# Patient Record
Sex: Female | Born: 1950 | Race: White | Hispanic: No | Marital: Married | Smoking: Former smoker
Health system: Southern US, Community
[De-identification: ages and names within clinical notes are randomized; demographics above are authoritative.]

## PROBLEM LIST (undated history)

## (undated) DIAGNOSIS — F32A Depression, unspecified: Secondary | ICD-10-CM

## (undated) DIAGNOSIS — E559 Vitamin D deficiency, unspecified: Secondary | ICD-10-CM

## (undated) DIAGNOSIS — K222 Esophageal obstruction: Secondary | ICD-10-CM

## (undated) DIAGNOSIS — Z8719 Personal history of other diseases of the digestive system: Secondary | ICD-10-CM

## (undated) DIAGNOSIS — N390 Urinary tract infection, site not specified: Secondary | ICD-10-CM

## (undated) DIAGNOSIS — F329 Major depressive disorder, single episode, unspecified: Secondary | ICD-10-CM

## (undated) DIAGNOSIS — D649 Anemia, unspecified: Secondary | ICD-10-CM

## (undated) HISTORY — DX: Depression, unspecified: F32.A

## (undated) HISTORY — PX: ENDOMETRIAL ABLATION: SHX621

## (undated) HISTORY — DX: Major depressive disorder, single episode, unspecified: F32.9

## (undated) HISTORY — PX: TUBAL LIGATION: SHX77

## (undated) HISTORY — DX: Personal history of other diseases of the digestive system: Z87.19

## (undated) HISTORY — DX: Vitamin D deficiency, unspecified: E55.9

## (undated) HISTORY — DX: Urinary tract infection, site not specified: N39.0

## (undated) HISTORY — DX: Esophageal obstruction: K22.2

## (undated) HISTORY — PX: UPPER GASTROINTESTINAL ENDOSCOPY: SHX188

---

## 2004-07-02 HISTORY — PX: COLONOSCOPY: SHX174

## 2004-09-06 ENCOUNTER — Emergency Department: Payer: Self-pay | Admitting: Emergency Medicine

## 2004-11-23 ENCOUNTER — Ambulatory Visit: Payer: Self-pay | Admitting: Unknown Physician Specialty

## 2005-02-09 ENCOUNTER — Ambulatory Visit: Payer: Self-pay | Admitting: Obstetrics and Gynecology

## 2009-05-05 ENCOUNTER — Ambulatory Visit: Payer: Self-pay | Admitting: Unknown Physician Specialty

## 2009-07-21 ENCOUNTER — Ambulatory Visit: Payer: Self-pay | Admitting: Internal Medicine

## 2010-11-30 ENCOUNTER — Ambulatory Visit: Payer: Self-pay | Admitting: Family Medicine

## 2010-12-13 ENCOUNTER — Ambulatory Visit: Payer: Self-pay | Admitting: Family Medicine

## 2013-01-01 ENCOUNTER — Ambulatory Visit: Payer: Self-pay | Admitting: Unknown Physician Specialty

## 2013-06-11 ENCOUNTER — Ambulatory Visit: Payer: Self-pay | Admitting: Urology

## 2014-06-03 ENCOUNTER — Ambulatory Visit: Payer: Self-pay | Admitting: Cardiovascular Disease

## 2014-08-10 ENCOUNTER — Other Ambulatory Visit: Payer: Self-pay

## 2014-08-10 DIAGNOSIS — Z1231 Encounter for screening mammogram for malignant neoplasm of breast: Secondary | ICD-10-CM

## 2014-08-18 ENCOUNTER — Ambulatory Visit
Admission: RE | Admit: 2014-08-18 | Discharge: 2014-08-18 | Disposition: A | Discharge status: Home / Self Care | Payer: BLUE CROSS/BLUE SHIELD | Source: Ambulatory Visit

## 2014-08-18 DIAGNOSIS — Z1231 Encounter for screening mammogram for malignant neoplasm of breast: Secondary | ICD-10-CM

## 2014-10-01 ENCOUNTER — Ambulatory Visit
Admit: 2014-10-01 | Disposition: A | Discharge status: Home / Self Care | Payer: Self-pay | Attending: Family Medicine | Admitting: Family Medicine

## 2015-02-08 ENCOUNTER — Ambulatory Visit
Admission: EM | Admit: 2015-02-08 | Discharge: 2015-02-08 | Disposition: A | Discharge status: Home / Self Care | Payer: BLUE CROSS/BLUE SHIELD | Attending: Family Medicine | Admitting: Family Medicine

## 2015-02-08 ENCOUNTER — Other Ambulatory Visit: Payer: Self-pay

## 2015-02-08 ENCOUNTER — Encounter: Payer: Self-pay | Admitting: Emergency Medicine

## 2015-02-08 DIAGNOSIS — Z79899 Other long term (current) drug therapy: Secondary | ICD-10-CM | POA: Diagnosis not present

## 2015-02-08 DIAGNOSIS — Z87891 Personal history of nicotine dependence: Secondary | ICD-10-CM | POA: Diagnosis not present

## 2015-02-08 DIAGNOSIS — R002 Palpitations: Secondary | ICD-10-CM | POA: Insufficient documentation

## 2015-02-08 DIAGNOSIS — R000 Tachycardia, unspecified: Secondary | ICD-10-CM | POA: Diagnosis not present

## 2015-02-08 HISTORY — DX: Anemia, unspecified: D64.9

## 2015-02-08 LAB — COMPREHENSIVE METABOLIC PANEL
ALBUMIN: 4.3 g/dL (ref 3.5–5.0)
ALT: 22 U/L (ref 14–54)
AST: 19 U/L (ref 15–41)
Alkaline Phosphatase: 60 U/L (ref 38–126)
Anion gap: 8 (ref 5–15)
BILIRUBIN TOTAL: 0.7 mg/dL (ref 0.3–1.2)
BUN: 11 mg/dL (ref 6–20)
CALCIUM: 9.5 mg/dL (ref 8.9–10.3)
CO2: 26 mmol/L (ref 22–32)
CREATININE: 0.64 mg/dL (ref 0.44–1.00)
Chloride: 106 mmol/L (ref 101–111)
GFR calc Af Amer: >60 mL/min (ref >60)
GLUCOSE: 96 mg/dL (ref 65–99)
Potassium: 4.4 mmol/L (ref 3.5–5.1)
SODIUM: 140 mmol/L (ref 135–145)
Total Protein: 7.8 g/dL (ref 6.5–8.1)

## 2015-02-08 LAB — CBC WITH DIFFERENTIAL/PLATELET
Basophils Absolute: 0 10*3/uL (ref 0–0.1)
Basophils Relative: 1 %
EOS ABS: 0.1 10*3/uL (ref 0–0.7)
Eosinophils Relative: 2 %
HCT: 40.7 % (ref 35.0–47.0)
Hemoglobin: 13.7 g/dL (ref 12.0–16.0)
LYMPHS ABS: 2.1 10*3/uL (ref 1.0–3.6)
Lymphocytes Relative: 36 %
MCH: 28.6 pg (ref 26.0–34.0)
MCHC: 33.7 g/dL (ref 32.0–36.0)
MCV: 85 fL (ref 80.0–100.0)
MONOS PCT: 9 %
Monocytes Absolute: 0.5 10*3/uL (ref 0.2–0.9)
NEUTROS PCT: 52 %
Neutro Abs: 3 10*3/uL (ref 1.4–6.5)
Platelets: 237 10*3/uL (ref 150–440)
RBC: 4.79 MIL/uL (ref 3.80–5.20)
RDW: 13.5 % (ref 11.5–14.5)
WBC: 5.7 10*3/uL (ref 3.6–11.0)

## 2015-02-08 LAB — TSH: TSH: 1.619 u[IU]/mL (ref 0.350–4.500)

## 2015-02-08 NOTE — Discharge Instructions (Signed)
Holter Monitoring A Holter monitor is a small device with electrodes (small sticky patches) that attach to your chest. It records the electrical activity of your heart and is worn continuously for 24-48 hours.  A HOLTER MONITOR IS USED TO  Detect heart problems such as:  Heart arrhythmia. Is an abnormal or irregular heartbeat. With some heart arrhythmias, you may not feel or know that you have an irregular heart rhythm.  Palpitations, such as feeling your heart racing or fluttering. It is possible to have heart palpitations and not have a heart arrhythmia.  A heart rhythm that is too slow or too fast.  If you have problems fainting, near fainting or feeling light-headed, a Holter monitor may be worn to see if your heart is the cause. HOLTER MONITOR PREPARATION   Electrodes will be attached to the skin on your chest.  If you have hair on your chest, small areas may have to be shaved. This is done to help the patches stick better and make the recording more accurate.  The electrodes are attached by wires to the Holter monitor. The Holter monitor clips to your clothing. You will wear the monitor at all times, even while exercising and sleeping. HOME CARE INSTRUCTIONS   Wear your monitor at all times.  The wires and the monitor must stay dry. Do not get the monitor wet.  Do not bathe, swim or use a hot tub with it on.  You may do a "sponge" bath while you have the monitor on.  Keep your skin clean, do not put body lotion or moisturizer on your chest.  It's possible that your skin under the electrodes could become irritated. To keep this from happening, you may put the electrodes in slightly different places on your chest.  Your caregiver will also ask you to keep a diary of your activities, such as walking or doing chores. Be sure to note what you are doing if you experience heart symptoms such as palpitations. This will help your caregiver determine what might be contributing to your  symptoms. The information stored in your monitor will be reviewed by your caregiver alongside your diary entries.  Make sure the monitor is safely clipped to your clothing or in a location close to your body that your caregiver recommends.  The monitor and electrodes are removed when the test is over. Return the monitor as directed.  Be sure to follow up with your caregiver and discuss your Holter monitor results. SEEK IMMEDIATE MEDICAL CARE IF:  You faint or feel lightheaded.  You have trouble breathing.  You get pain in your chest, upper arm or jaw.  You feel sick to your stomach and your skin is pale, cool, or damp.  You think something is wrong with the way your heart is beating. MAKE SURE YOU:   Understand these instructions.  Will watch your condition.  Will get help right away if you are not doing well or get worse. Document Released: 03/16/2004 Document Revised: 09/10/2011 Document Reviewed: 07/29/2008 ExitCare Patient Information 2015 [employer]. This information is not intended to replace advice given to you by your health care provider. Make sure you discuss any questions you have with your health care provider.  

## 2015-02-08 NOTE — ED Notes (Signed)
Pt states she has a fast heart beat x months thios am had nausea and feels funny

## 2015-02-08 NOTE — ED Provider Notes (Signed)
CSN: [id number]     Arrival date & time 02/08/15  1052 History   First MD Initiated Contact with Patient 02/08/15 1102     Chief Complaint  Patient presents with  . Palpitations   (Consider location/radiation/quality/duration/timing/severity/associated sxs/prior Treatment) HPI Comments: 64 yo female with a several months h/o intermittent episodes of palpitations, fatigue. States today at work had sudden onset of palpitations, nausea, shortness of breath and became sweaty. Symptoms currently resolved. Currently denies any chest pains or shortness of breath. No h/o cardiac risk factors.   The history is provided by the patient.    Past Medical History  Diagnosis Date  . Anemia    No past surgical history on file. Family History  Problem Relation Age of Onset  . Cancer Mother   . Cancer Father    History  Substance Use Topics  . Smoking status: Former Games developer  . Smokeless tobacco: Never Used  . Alcohol Use: No   OB History    No data available     Review of Systems  Allergies  Review of patient's allergies indicates no known allergies.  Home Medications   Prior to Admission medications   Medication Sig Start Date End Date Taking? Authorizing Provider  omeprazole (PRILOSEC) 20 MG capsule Take 20 mg by mouth daily.   Yes Historical Provider, MD   BP 135/88 mmHg  Pulse 103  Temp(Src) 97.6 F (36.4 C) (Tympanic)  Resp 18  Ht 5\' 4"  (1.626 m)  Wt 170 lb (77.111 kg)  BMI 29.17 kg/m2  SpO2 99% Physical Exam  Constitutional: She is oriented to person, place, and time. She appears well-developed and well-nourished. No distress.  HENT:  Head: Normocephalic and atraumatic.  Right Ear: Tympanic membrane, external ear and ear canal normal.  Left Ear: Tympanic membrane, external ear and ear canal normal.  Nose: Nose normal.  Mouth/Throat: Oropharynx is clear and moist and mucous membranes are normal.  Eyes: Conjunctivae and EOM are normal. Pupils are equal, round, and  reactive to light. Right eye exhibits no discharge. Left eye exhibits no discharge. No scleral icterus.  Neck: Normal range of motion. Neck supple. No JVD present. No tracheal deviation present. No thyromegaly present.  Cardiovascular: Normal rate, regular rhythm, normal heart sounds and intact distal pulses.   No murmur heard. Pulmonary/Chest: Effort normal and breath sounds normal. No stridor. No respiratory distress. She has no wheezes. She has no rales. She exhibits no tenderness.  Abdominal: Soft. Bowel sounds are normal. She exhibits no distension and no mass. There is no tenderness. There is no rebound and no guarding.  Musculoskeletal: She exhibits no edema.  Lymphadenopathy:    She has no cervical adenopathy.  Neurological: She is alert and oriented to person, place, and time.  Skin: Skin is warm and dry. No rash noted. She is not diaphoretic. No erythema. No pallor.  Psychiatric: She has a normal mood and affect. Her behavior is normal. Judgment and thought content normal.  Nursing note and vitals reviewed.   ED Course  Procedures (including critical care time) Labs Review Labs Reviewed  COMPREHENSIVE METABOLIC PANEL  CBC WITH DIFFERENTIAL/PLATELET  TSH    Imaging Review No results found.  EKG: there are no previous tracings available for comparison, normal sinus rhythm; reviewed by me and agree with reading.  MDM   1. Palpitations   (unknown etiology)  Plan: 1. Test results and diagnosis reviewed with patient 2. Recommend follow up with cardiologist for possible stress test and Holter monitor  3.. F/u prn if symptoms worsen or don't improve    Payton Mccallum, MD 02/08/15 [phone]

## 2015-02-17 ENCOUNTER — Ambulatory Visit: Payer: BLUE CROSS/BLUE SHIELD | Admitting: Cardiovascular Disease

## 2015-03-30 ENCOUNTER — Ambulatory Visit: Payer: BLUE CROSS/BLUE SHIELD | Admitting: Cardiovascular Disease

## 2016-08-24 ENCOUNTER — Other Ambulatory Visit: Payer: Self-pay | Admitting: Family Medicine

## 2016-08-24 DIAGNOSIS — Z1231 Encounter for screening mammogram for malignant neoplasm of breast: Secondary | ICD-10-CM

## 2016-09-12 ENCOUNTER — Ambulatory Visit: Payer: BLUE CROSS/BLUE SHIELD

## 2016-09-19 ENCOUNTER — Ambulatory Visit: Payer: BLUE CROSS/BLUE SHIELD

## 2016-10-17 ENCOUNTER — Ambulatory Visit
Admission: RE | Admit: 2016-10-17 | Discharge: 2016-10-17 | Disposition: A | Discharge status: Home / Self Care | Payer: 59 | Source: Ambulatory Visit | Attending: Family Medicine | Admitting: Family Medicine

## 2016-10-17 DIAGNOSIS — Z1231 Encounter for screening mammogram for malignant neoplasm of breast: Secondary | ICD-10-CM

## 2017-09-10 ENCOUNTER — Other Ambulatory Visit: Payer: Self-pay | Admitting: Family Medicine

## 2017-09-10 DIAGNOSIS — Z1231 Encounter for screening mammogram for malignant neoplasm of breast: Secondary | ICD-10-CM

## 2017-10-29 ENCOUNTER — Ambulatory Visit: Payer: 59

## 2018-03-18 ENCOUNTER — Encounter: Payer: Self-pay | Admitting: *Deleted

## 2018-03-18 ENCOUNTER — Inpatient Hospital Stay: Payer: BLUE CROSS/BLUE SHIELD | Attending: Internal Medicine | Admitting: Internal Medicine

## 2018-03-18 DIAGNOSIS — Z87891 Personal history of nicotine dependence: Secondary | ICD-10-CM | POA: Diagnosis not present

## 2018-03-18 DIAGNOSIS — D709 Neutropenia, unspecified: Secondary | ICD-10-CM | POA: Insufficient documentation

## 2018-03-18 DIAGNOSIS — D708 Other neutropenia: Secondary | ICD-10-CM | POA: Insufficient documentation

## 2018-03-18 NOTE — Progress Notes (Signed)
[facility] CONSULT NOTE  Patient Care Team: Hortencia Pilar, MD as PCP - General (Family Medicine)  CHIEF COMPLAINTS/PURPOSE OF CONSULTATION:  Neutropenia.  # INTERMITTENT NEUTROPENIA [ANC 1.1- 1.6] since 2015; Asymptomatic; CMP normal.    No history exists.     HISTORY OF PRESENTING ILLNESS:  Shelby Craig 67 y.o.  female with no significant past medical history except for reflux disease on Prilosec has been referred to [place] for further evaluation recommendations for neutropenia.  Patient states that she had blood work done on a routine basis that was abnormal-with slightly low neutrophil count of 1.1 [normal greater than 2]  The patient denies any frequent history of infections-like bronchitis pneumonia or sinus infections.  Denies any new medications.  Patient stated that she was diagnosed with anemia 12 to 13 years ago for which she received PRBC transfusions.  However no problems with anemia in the last many years.  Patient has chronic mild joints pain.  Patient states that she has been under a lot of stress more recently.  Review of Systems  Constitutional: Negative for chills, diaphoresis, fever, malaise/fatigue and weight loss.  HENT: Negative for nosebleeds and sore throat.   Eyes: Negative for double vision.  Respiratory: Negative for cough, hemoptysis, sputum production, shortness of breath and wheezing.   Cardiovascular: Negative for chest pain, palpitations, orthopnea and leg swelling.  Gastrointestinal: Negative for abdominal pain, blood in stool, constipation, diarrhea, heartburn, melena, nausea and vomiting.  Genitourinary: Negative for dysuria, frequency and urgency.  Musculoskeletal: Positive for joint pain.  Skin: Negative.  Negative for itching and rash.  Neurological: Negative for dizziness, tingling, focal weakness, weakness and headaches.  Endo/Heme/Allergies: Does not bruise/bleed easily.  Psychiatric/Behavioral: Negative for  depression. The patient is not nervous/anxious and does not have insomnia.      MEDICAL HISTORY:  Past Medical History:  Diagnosis Date  . Anemia   . Chronic UTI   . Depression   . Esophageal stricture   . H/O gastroesophageal reflux (GERD)   . History of esophageal stricture   . Vitamin D deficiency     SURGICAL HISTORY: Past Surgical History:  Procedure Laterality Date  . COLONOSCOPY  2006  . ENDOMETRIAL ABLATION    . TUBAL LIGATION    . UPPER GASTROINTESTINAL ENDOSCOPY  01/01/2013, 11/24/2004    SOCIAL HISTORY: Denies history of smoking.  Denies any alcohol abuse. Social History   Socioeconomic History  . Marital status: Married    Spouse name: Not on file  . Number of children: Not on file  . Years of education: Not on file  . Highest education level: Not on file  Occupational History  . Not on file  Social Needs  . Financial resource strain: Not on file  . Food insecurity:    Worry: Not on file    Inability: Not on file  . Transportation needs:    Medical: Not on file    Non-medical: Not on file  Tobacco Use  . Smoking status: Former Research scientist (life sciences)  . Smokeless tobacco: Never Used  Substance and Sexual Activity  . Alcohol use: No  . Drug use: Never  . Sexual activity: Not on file  Lifestyle  . Physical activity:    Days per week: Not on file    Minutes per session: Not on file  . Stress: Not on file  Relationships  . Social connections:    Talks on phone: Not on file    Gets together: Not on file  Attends religious service: Not on file    Active member of club or organization: Not on file    Attends meetings of clubs or organizations: Not on file    Relationship status: Not on file  . Intimate partner violence:    Fear of current or ex partner: Not on file    Emotionally abused: Not on file    Physically abused: Not on file    Forced sexual activity: Not on file  Other Topics Concern  . Not on file  Social History Narrative  . Not on file     FAMILY HISTORY: Family History  Problem Relation Age of Onset  . Cancer Mother   . Cancer Father   . Multiple myeloma Father        multiple myeloma, agent orange exposure  . Breast cancer Maternal Aunt   . HIV Brother   . Brain cancer Paternal Grandmother     ALLERGIES:  is allergic to codeine and wasp venom protein.  MEDICATIONS:  Current Outpatient Medications  Medication Sig Dispense Refill  . omeprazole (PRILOSEC) 20 MG capsule Take 20 mg by mouth daily.     No current facility-administered medications for this visit.       Marland Kitchen  PHYSICAL EXAMINATION: ECOG PERFORMANCE STATUS: 1 - Symptomatic but completely ambulatory  Vitals:   03/18/18 1424  BP: (!) 147/77  Pulse: 88  Resp: 16  Temp: 98.2 F (36.8 C)   Filed Weights   03/18/18 1410 03/18/18 1424  Weight: 184 lb 8.4 oz (83.7 kg) 186 lb 15.2 oz (84.8 kg)    Physical Exam  Constitutional: She is oriented to person, place, and time and well-developed, well-nourished, and in no distress.  HENT:  Head: Normocephalic and atraumatic.  Mouth/Throat: Oropharynx is clear and moist. No oropharyngeal exudate.  Eyes: Pupils are equal, round, and reactive to light.  Neck: Normal range of motion. Neck supple.  Cardiovascular: Normal rate and regular rhythm.  Pulmonary/Chest: No respiratory distress. She has no wheezes.  Abdominal: Soft. Bowel sounds are normal. She exhibits no distension and no mass. There is no tenderness. There is no rebound and no guarding.  Musculoskeletal: Normal range of motion. She exhibits no edema or tenderness.  Neurological: She is alert and oriented to person, place, and time.  Skin: Skin is warm.  Psychiatric: Affect normal.     LABORATORY DATA:  I have reviewed the data as listed Lab Results  Component Value Date   WBC 5.7 02/08/2015   HGB 13.7 02/08/2015   HCT 40.7 02/08/2015   MCV 85.0 02/08/2015   PLT 237 02/08/2015   No results for input(s): NA, K, CL, CO2, GLUCOSE, BUN,  CREATININE, CALCIUM, GFRNONAA, GFRAA, PROT, ALBUMIN, AST, ALT, ALKPHOS, BILITOT, BILIDIR, IBILI in the last 8760 hours.  RADIOGRAPHIC STUDIES: I have personally reviewed the radiological images as listed and agreed with the findings in the report. No results found.  ASSESSMENT & PLAN:   Other neutropenia ([place]) #Chronic mild intermittent neutropenia [since 2015] with normal white count hemoglobin.  The etiology is unclear however I suspect likely benign etiology.  #Patient is currently asymptomatic from her mild neutropenia-hence I would recommend against any extensive work-up at this time. Clinically, this is very unlikely to be of malignant etiology.  #I would recommend repeating a CBC in approximately 3 months check D32 folic acid; hepatitis HIV testing.  However if counts are getting worse-I would recommend further work-up with imaging/also a bone marrow biopsy at that time.  #  45 minutes face-to-face with the patient discussing the above plan of care; more than 50% of time spent on counseling and coordination.  Thank you Dr. Hoy Morn for allowing me to participate in the care of your pleasant patient. Please do not hesitate to contact me with questions or concerns in the interim.  All questions were answered. The patient knows to call the clinic with any problems, questions or concerns.   Cammie Sickle, MD 03/18/2018 3:30 PM

## 2018-03-18 NOTE — Assessment & Plan Note (Addendum)
#  Chronic mild intermittent neutropenia [since 2015] with normal white count hemoglobin.  The etiology is unclear however I suspect likely benign etiology.  #Patient is currently asymptomatic from her mild neutropenia-hence I would recommend against any extensive work-up at this time. Clinically, this is very unlikely to be of malignant etiology.  #I would recommend repeating a CBC in approximately 3 months check P94 folic acid; hepatitis HIV testing.  However if counts are getting worse-I would recommend further work-up with imaging/also a bone marrow biopsy at that time.  # 45 minutes face-to-face with the patient discussing the above plan of care; more than 50% of time spent on counseling and coordination.  Thank you Dr. Hoy Morn for allowing me to participate in the care of your pleasant patient. Please do not hesitate to contact me with questions or concerns in the interim.

## 2018-06-17 ENCOUNTER — Ambulatory Visit: Payer: BLUE CROSS/BLUE SHIELD | Admitting: Internal Medicine

## 2018-06-17 ENCOUNTER — Other Ambulatory Visit: Payer: BLUE CROSS/BLUE SHIELD

## 2019-03-19 ENCOUNTER — Emergency Department: Payer: BC Managed Care – PPO

## 2019-03-19 ENCOUNTER — Encounter: Payer: Self-pay | Admitting: Emergency Medicine

## 2019-03-19 ENCOUNTER — Other Ambulatory Visit: Payer: Self-pay

## 2019-03-19 ENCOUNTER — Emergency Department
Admission: EM | Admit: 2019-03-19 | Discharge: 2019-03-19 | Disposition: A | Discharge status: Home / Self Care | Payer: BC Managed Care – PPO | Attending: Emergency Medicine | Admitting: Emergency Medicine

## 2019-03-19 DIAGNOSIS — I484 Atypical atrial flutter: Secondary | ICD-10-CM | POA: Diagnosis not present

## 2019-03-19 DIAGNOSIS — Z87891 Personal history of nicotine dependence: Secondary | ICD-10-CM | POA: Insufficient documentation

## 2019-03-19 DIAGNOSIS — R000 Tachycardia, unspecified: Secondary | ICD-10-CM | POA: Diagnosis present

## 2019-03-19 DIAGNOSIS — I4892 Unspecified atrial flutter: Secondary | ICD-10-CM

## 2019-03-19 LAB — COMPREHENSIVE METABOLIC PANEL
ALT: 18 U/L (ref 0–44)
AST: 22 U/L (ref 15–41)
Albumin: 3.9 g/dL (ref 3.5–5.0)
Alkaline Phosphatase: 60 U/L (ref 38–126)
Anion gap: 7 (ref 5–15)
BUN: 8 mg/dL (ref 8–23)
CO2: 22 mmol/L (ref 22–32)
Calcium: 8.7 mg/dL — ABNORMAL LOW (ref 8.9–10.3)
Chloride: 112 mmol/L — ABNORMAL HIGH (ref 98–111)
Creatinine, Ser: 0.59 mg/dL (ref 0.44–1.00)
GFR calc Af Amer: >60 mL/min (ref >60)
GFR calc non Af Amer: >60 mL/min (ref >60)
Glucose, Bld: 95 mg/dL (ref 70–99)
Potassium: 3.7 mmol/L (ref 3.5–5.1)
Sodium: 141 mmol/L (ref 135–145)
Total Bilirubin: 1.1 mg/dL (ref 0.3–1.2)
Total Protein: 7.3 g/dL (ref 6.5–8.1)

## 2019-03-19 LAB — CBC WITH DIFFERENTIAL/PLATELET
Abs Immature Granulocytes: 0 10*3/uL (ref 0.00–0.07)
Basophils Absolute: 0 10*3/uL (ref 0.0–0.1)
Basophils Relative: 1 %
Eosinophils Absolute: 0.1 10*3/uL (ref 0.0–0.5)
Eosinophils Relative: 3 %
HCT: 40.7 % (ref 36.0–46.0)
Hemoglobin: 13.2 g/dL (ref 12.0–15.0)
Immature Granulocytes: 0 %
Lymphocytes Relative: 54 %
Lymphs Abs: 2.4 10*3/uL (ref 0.7–4.0)
MCH: 28.5 pg (ref 26.0–34.0)
MCHC: 32.4 g/dL (ref 30.0–36.0)
MCV: 87.9 fL (ref 80.0–100.0)
Monocytes Absolute: 0.4 10*3/uL (ref 0.1–1.0)
Monocytes Relative: 9 %
Neutro Abs: 1.5 10*3/uL — ABNORMAL LOW (ref 1.7–7.7)
Neutrophils Relative %: 33 %
Platelets: 253 10*3/uL (ref 150–400)
RBC: 4.63 MIL/uL (ref 3.87–5.11)
RDW: 13.1 % (ref 11.5–15.5)
WBC: 4.5 10*3/uL (ref 4.0–10.5)
nRBC: 0 % (ref 0.0–0.2)

## 2019-03-19 LAB — URINALYSIS, COMPLETE (UACMP) WITH MICROSCOPIC
Bacteria, UA: NONE SEEN
Bilirubin Urine: NEGATIVE
Glucose, UA: NEGATIVE mg/dL
Hgb urine dipstick: NEGATIVE
Ketones, ur: NEGATIVE mg/dL
Leukocytes,Ua: NEGATIVE
Nitrite: NEGATIVE
Protein, ur: NEGATIVE mg/dL
Specific Gravity, Urine: 1.003 — ABNORMAL LOW (ref 1.005–1.030)
pH: 7 (ref 5.0–8.0)

## 2019-03-19 LAB — TROPONIN I (HIGH SENSITIVITY): Troponin I (High Sensitivity): 3 ng/L (ref <18)

## 2019-03-19 LAB — TSH: TSH: 2.51 u[IU]/mL (ref 0.350–4.500)

## 2019-03-19 MED ORDER — METOPROLOL SUCCINATE ER 50 MG PO TB24
25.0000 mg | ORAL_TABLET | Freq: Every day | ORAL | Status: DC
  Administered 2019-03-19: 25 mg via ORAL
  Filled 2019-03-19: qty 1

## 2019-03-19 MED ORDER — METOPROLOL SUCCINATE ER 25 MG PO TB24: 25.0000 mg | ORAL_TABLET | Freq: Every day | ORAL | 11 refills | Status: AC

## 2019-03-19 NOTE — ED Notes (Signed)
Pt unhooked to ambulate to restroom 

## 2019-03-19 NOTE — ED Provider Notes (Signed)
[facility] Emergency Department Provider Note   ____________________________________________   First MD Initiated Contact with Patient 03/19/19 [phone]     (approximate)  I have reviewed the triage vital signs and the nursing notes.   HISTORY  Chief Complaint Tachycardia   HPI Shelby Craig is a 68 y.o. female who reports a history of intermittent episodes of tachycardia.  She is never been found to have any problems when heart monitor is been applied but today she was driving and got lightheaded and sweaty with rapid heartbeat EMS got her and found A.  Flutter.  She got some IV beta-blocker and heart rate normalized.  Here in the emergency room she is feeling much better she is having A.  Flutter at 87.  She denies more than 1-1/2 cups of caffeine a day coffee.  She is not drinking alcohol.  She feels well now.  She never actually had chest pain or tightness.         Past Medical History:  Diagnosis Date  . Anemia   . Chronic UTI   . Depression   . Esophageal stricture   . H/O gastroesophageal reflux (GERD)   . History of esophageal stricture   . Vitamin D deficiency     Patient Active Problem List   Diagnosis Date Noted  . Other neutropenia ([place]) 03/18/2018    Past Surgical History:  Procedure Laterality Date  . COLONOSCOPY  2006  . ENDOMETRIAL ABLATION    . TUBAL LIGATION    . UPPER GASTROINTESTINAL ENDOSCOPY  01/01/2013, 11/24/2004    Prior to Admission medications   Medication Sig Start Date End Date Taking? Authorizing Provider  metoprolol succinate (TOPROL XL) 25 MG 24 hr tablet Take 1 tablet (25 mg total) by mouth daily. 03/19/19 03/18/20  Nena Polio, MD  omeprazole (PRILOSEC) 20 MG capsule Take 20 mg by mouth daily.    [provider]    Allergies Codeine and Wasp venom protein  Family History  Problem Relation Age of Onset  . Cancer Mother   . Cancer Father   . Multiple myeloma Father        multiple  myeloma, agent orange exposure  . Breast cancer Maternal Aunt   . HIV Brother   . Brain cancer Paternal Grandmother     Social History Social History   Tobacco Use  . Smoking status: Former Research scientist (life sciences)  . Smokeless tobacco: Never Used  Substance Use Topics  . Alcohol use: No  . Drug use: Never    Review of Systems  Constitutional: No fever/chills Eyes: No visual changes. ENT: No sore throat. Cardiovascular: Denies chest pain. Respiratory: Denies shortness of breath. Gastrointestinal: No abdominal pain.  No nausea, no vomiting.  No diarrhea.  No constipation. Genitourinary: Negative for dysuria. Musculoskeletal: Negative for back pain. Skin: Negative for rash. Neurological: Negative for headaches, focal weakness   ____________________________________________   PHYSICAL EXAM:  VITAL SIGNS: ED Triage Vitals  Enc Vitals Group     BP 03/19/19 0956 (!) 150/109     Pulse Rate 03/19/19 0956 84     Resp 03/19/19 0956 16     Temp 03/19/19 0956 98 F (36.7 C)     Temp Source 03/19/19 0956 Oral     SpO2 03/19/19 0956 100 %     Weight 03/19/19 0953 175 lb (79.4 kg)     Height 03/19/19 0953 '5\' 4"'$  (1.626 m)     Head Circumference --  Peak Flow --      Pain Score 03/19/19 0953 0     Pain Loc --      Pain Edu? --      Excl. in [place]? --     Constitutional: Alert and oriented. Well appearing and in no acute distress. Eyes: Conjunctivae are normal. Head: Atraumatic. Nose: No congestion/rhinnorhea. Mouth/Throat: Mucous membranes are moist.  Oropharynx non-erythematous. Neck: No stridor.   Cardiac: Normal  heart sounds.  Irregular rhythm good peripheral circulation. Respiratory: Normal respiratory effort.  No retractions. Lungs CTAB. Gastrointestinal: Soft and nontender. No distention. No abdominal bruits. No CVA tenderness. Musculoskeletal: No lower extremity tenderness nor edema.   Neurologic:  Normal speech and language. No gross focal neurologic deficits are appreciated. N  Skin:  Skin is warm, dry and intact. No rash noted.   ____________________________________________   LABS (all labs ordered are listed, but only abnormal results are displayed)  Labs Reviewed  CBC WITH DIFFERENTIAL/PLATELET - Abnormal; Notable for the following components:      Result Value   Neutro Abs 1.5 (*)    All other components within normal limits  COMPREHENSIVE METABOLIC PANEL - Abnormal; Notable for the following components:   Chloride 112 (*)    Calcium 8.7 (*)    All other components within normal limits  URINALYSIS, COMPLETE (UACMP) WITH MICROSCOPIC - Abnormal; Notable for the following components:   Color, Urine STRAW (*)    APPearance CLEAR (*)    Specific Gravity, Urine 1.003 (*)    All other components within normal limits  TSH  TROPONIN I (HIGH SENSITIVITY)   ____________________________________________  EKG  EKG read interpreted by me shows a flutter at a rate of 87 normal axis no acute ST-T changes are visible ___EKG #2 read and interpreted by me shows normal sinus rhythm at 74 normal axis no acute ST-T wave changes looks similar to #1 except for the rate.  Also looks similar to EKG from 2016.  _________________________________________  RADIOLOGY  ED MD interpretation: Chest x-ray read by me looks normal  Official radiology report(s): Dg Chest Portable 1 View  Result Date: 03/19/2019 CLINICAL DATA:  Shortness of breath and tachycardia. EXAM: PORTABLE CHEST 1 VIEW COMPARISON:  October 01, 2014 FINDINGS: Cardiomediastinal silhouette is normal. Mediastinal contours appear intact. There is no evidence of focal airspace consolidation, pleural effusion or pneumothorax. Osseous structures are without acute abnormality. Soft tissues are grossly normal. IMPRESSION: No active disease. Electronically Signed   By: Fidela Salisbury M.D.   On: 03/19/2019 10:57    ____________________________________________   PROCEDURES  Procedure(s) performed (including Critical  Care):  Procedures   ____________________________________________   INITIAL IMPRESSION / ASSESSMENT AND PLAN / ED COURSE Shelby Craig was evaluated in Emergency Department on 03/19/2019 for the symptoms described in the history of present illness. She was evaluated in the context of the global COVID-19 pandemic, which necessitated consideration that the patient might be at risk for infection with the SARS-CoV-2 virus that causes COVID-19. Institutional protocols and algorithms that pertain to the evaluation of patients at risk for COVID-19 are in a state of rapid change based on information released by regulatory bodies including the CDC and federal and state organizations. These policies and algorithms were followed during the patient's care in the ED.    Patient says she wants to follow-up with her husband's cardiologist she describes what sounds like [facility].  I discussed her case with Dr. Rockey Situ.  We will give her some metoprolol  low-dose 25 once a day he will see her in the office.  She will return if worse.          ____________________________________________   FINAL CLINICAL IMPRESSION(S) / ED DIAGNOSES  Final diagnoses:  Atrial flutter, unspecified type [facility])     ED Discharge Orders         Ordered    metoprolol succinate (TOPROL XL) 25 MG 24 hr tablet  Daily     03/19/19 1219           Note:  This document was prepared using Dragon voice recognition software and may include unintentional dictation errors.    Nena Polio, MD 03/19/19 [phone]

## 2019-03-19 NOTE — Discharge Instructions (Addendum)
Take the metoprolol take the metoprolol 1 pill a day which should help control your heart rate.  Dose should be low enough that it should not make your blood pressure too low.  Give Dr. Donivan Scull office a call when you get home.  Let them know you are in the emergency room with atrial flutter/fibrillation and they should be able to get you in the office fairly quickly.  Please return if you have any further problems.  Rest and take it easy for the next day or so.  Start metoprolol tomorrow.  We will give you the first dose here in the emergency room for today.

## 2019-03-19 NOTE — ED Notes (Signed)
Informed patient unable to draw labs from IV as it will not work and will have to use a butterfly needle to get them. Pt asked if she can refuse.  Explained that she is able to refuse anything but will be limited in assessing cause for symptoms without labs. Asked pt if ok for [place] to draw blood, pt states "yes I just want to make sure I have control".

## 2019-03-19 NOTE — ED Triage Notes (Signed)
Pt here for weakness/dizziness that started yesterday.  Pt felt like HR was racing. Pt had HR 170 with EMS and received 2.5 mg metoprolol which has brought HR down to normal rate.  Pt had SHOB when HR was elevated but this is gone now.  Denies weakness or dizziness at this time.

## 2019-03-26 ENCOUNTER — Telehealth: Payer: Self-pay

## 2019-03-26 NOTE — Telephone Encounter (Signed)
LMOM to schedule.

## 2019-03-26 NOTE — Telephone Encounter (Signed)
-----   Message from Minna Merritts, MD sent at 03/19/2019 12:17 PM EDT ----- Regarding: er Seen in the ER with atrial fib/flutter Needs appt with me  Thx TG

## 2019-06-05 ENCOUNTER — Encounter (HOSPITAL_COMMUNITY): Payer: Self-pay

## 2019-06-05 ENCOUNTER — Inpatient Hospital Stay
Admission: EM | Admit: 2019-06-05 | Discharge: 2019-06-05 | DRG: 177 | Disposition: A | Discharge status: Other Acute Inpatient Hospital | Payer: BC Managed Care – PPO | Attending: Family Medicine | Admitting: Family Medicine

## 2019-06-05 ENCOUNTER — Inpatient Hospital Stay: Payer: BC Managed Care – PPO

## 2019-06-05 ENCOUNTER — Emergency Department: Payer: BC Managed Care – PPO

## 2019-06-05 ENCOUNTER — Inpatient Hospital Stay (HOSPITAL_COMMUNITY)
Admission: AD | Admit: 2019-06-05 | Discharge: 2019-06-07 | DRG: 177 | Disposition: A | Discharge status: Home / Self Care | Payer: BC Managed Care – PPO | Source: Other Acute Inpatient Hospital | Attending: Internal Medicine | Admitting: Internal Medicine

## 2019-06-05 ENCOUNTER — Other Ambulatory Visit: Payer: Self-pay

## 2019-06-05 DIAGNOSIS — I482 Chronic atrial fibrillation, unspecified: Secondary | ICD-10-CM | POA: Diagnosis present

## 2019-06-05 DIAGNOSIS — J9601 Acute respiratory failure with hypoxia: Secondary | ICD-10-CM | POA: Diagnosis not present

## 2019-06-05 DIAGNOSIS — E876 Hypokalemia: Secondary | ICD-10-CM | POA: Diagnosis present

## 2019-06-05 DIAGNOSIS — Z91038 Other insect allergy status: Secondary | ICD-10-CM | POA: Diagnosis not present

## 2019-06-05 DIAGNOSIS — I480 Paroxysmal atrial fibrillation: Secondary | ICD-10-CM | POA: Diagnosis present

## 2019-06-05 DIAGNOSIS — Z79899 Other long term (current) drug therapy: Secondary | ICD-10-CM | POA: Diagnosis not present

## 2019-06-05 DIAGNOSIS — I4891 Unspecified atrial fibrillation: Secondary | ICD-10-CM | POA: Diagnosis not present

## 2019-06-05 DIAGNOSIS — J1289 Other viral pneumonia: Secondary | ICD-10-CM

## 2019-06-05 DIAGNOSIS — K222 Esophageal obstruction: Secondary | ICD-10-CM | POA: Diagnosis present

## 2019-06-05 DIAGNOSIS — R739 Hyperglycemia, unspecified: Secondary | ICD-10-CM | POA: Diagnosis present

## 2019-06-05 DIAGNOSIS — E559 Vitamin D deficiency, unspecified: Secondary | ICD-10-CM | POA: Diagnosis present

## 2019-06-05 DIAGNOSIS — Z66 Do not resuscitate: Secondary | ICD-10-CM | POA: Diagnosis present

## 2019-06-05 DIAGNOSIS — U071 COVID-19: Principal | ICD-10-CM | POA: Diagnosis present

## 2019-06-05 DIAGNOSIS — Z885 Allergy status to narcotic agent status: Secondary | ICD-10-CM

## 2019-06-05 DIAGNOSIS — I959 Hypotension, unspecified: Secondary | ICD-10-CM | POA: Diagnosis present

## 2019-06-05 DIAGNOSIS — Z87891 Personal history of nicotine dependence: Secondary | ICD-10-CM

## 2019-06-05 DIAGNOSIS — J1282 Pneumonia due to coronavirus disease 2019: Secondary | ICD-10-CM | POA: Diagnosis present

## 2019-06-05 DIAGNOSIS — R0902 Hypoxemia: Secondary | ICD-10-CM | POA: Diagnosis present

## 2019-06-05 DIAGNOSIS — K219 Gastro-esophageal reflux disease without esophagitis: Secondary | ICD-10-CM | POA: Diagnosis present

## 2019-06-05 LAB — PROCALCITONIN: Procalcitonin: <0.1 ng/mL

## 2019-06-05 LAB — CBC WITH DIFFERENTIAL/PLATELET
Abs Immature Granulocytes: 0.01 10*3/uL (ref 0.00–0.07)
Basophils Absolute: 0 10*3/uL (ref 0.0–0.1)
Basophils Relative: 1 %
Eosinophils Absolute: 0 10*3/uL (ref 0.0–0.5)
Eosinophils Relative: 0 %
HCT: 41.9 % (ref 36.0–46.0)
Hemoglobin: 13.9 g/dL (ref 12.0–15.0)
Immature Granulocytes: 0 %
Lymphocytes Relative: 36 %
Lymphs Abs: 1.5 10*3/uL (ref 0.7–4.0)
MCH: 27.9 pg (ref 26.0–34.0)
MCHC: 33.2 g/dL (ref 30.0–36.0)
MCV: 84.1 fL (ref 80.0–100.0)
Monocytes Absolute: 0.3 10*3/uL (ref 0.1–1.0)
Monocytes Relative: 7 %
Neutro Abs: 2.3 10*3/uL (ref 1.7–7.7)
Neutrophils Relative %: 56 %
Platelets: 165 10*3/uL (ref 150–400)
RBC: 4.98 MIL/uL (ref 3.87–5.11)
RDW: 13.1 % (ref 11.5–15.5)
WBC: 4.1 10*3/uL (ref 4.0–10.5)
nRBC: 0 % (ref 0.0–0.2)

## 2019-06-05 LAB — COMPREHENSIVE METABOLIC PANEL
ALT: 22 U/L (ref 0–44)
AST: 27 U/L (ref 15–41)
Albumin: 3.9 g/dL (ref 3.5–5.0)
Alkaline Phosphatase: 54 U/L (ref 38–126)
Anion gap: 10 (ref 5–15)
BUN: 11 mg/dL (ref 8–23)
CO2: 20 mmol/L — ABNORMAL LOW (ref 22–32)
Calcium: 8.4 mg/dL — ABNORMAL LOW (ref 8.9–10.3)
Chloride: 106 mmol/L (ref 98–111)
Creatinine, Ser: 0.72 mg/dL (ref 0.44–1.00)
GFR calc Af Amer: >60 mL/min (ref >60)
GFR calc non Af Amer: >60 mL/min (ref >60)
Glucose, Bld: 127 mg/dL — ABNORMAL HIGH (ref 70–99)
Potassium: 3.3 mmol/L — ABNORMAL LOW (ref 3.5–5.1)
Sodium: 136 mmol/L (ref 135–145)
Total Bilirubin: 0.8 mg/dL (ref 0.3–1.2)
Total Protein: 7.7 g/dL (ref 6.5–8.1)

## 2019-06-05 LAB — POC SARS CORONAVIRUS 2 AG: SARS Coronavirus 2 Ag: POSITIVE — AB

## 2019-06-05 LAB — C-REACTIVE PROTEIN: CRP: 1.8 mg/dL — ABNORMAL HIGH (ref <1.0)

## 2019-06-05 LAB — LACTIC ACID, PLASMA: Lactic Acid, Venous: 1.5 mmol/L (ref 0.5–1.9)

## 2019-06-05 LAB — LACTATE DEHYDROGENASE: LDH: 176 U/L (ref 98–192)

## 2019-06-05 LAB — TRIGLYCERIDES: Triglycerides: 67 mg/dL (ref <150)

## 2019-06-05 LAB — FIBRIN DERIVATIVES D-DIMER (ARMC ONLY): Fibrin derivatives D-dimer (ARMC): 867.96 ng/mL (FEU) — ABNORMAL HIGH (ref 0.00–499.00)

## 2019-06-05 LAB — FERRITIN: Ferritin: 104 ng/mL (ref 11–307)

## 2019-06-05 LAB — HIV ANTIBODY (ROUTINE TESTING W REFLEX): HIV Screen 4th Generation wRfx: NONREACTIVE

## 2019-06-05 LAB — FIBRINOGEN: Fibrinogen: 452 mg/dL (ref 210–475)

## 2019-06-05 MED ORDER — PANTOPRAZOLE SODIUM 40 MG PO TBEC
40.0000 mg | DELAYED_RELEASE_TABLET | Freq: Every day | ORAL | Status: DC
  Administered 2019-06-05 – 2019-06-07 (×3): 40 mg via ORAL
  Filled 2019-06-05 (×3): qty 1

## 2019-06-05 MED ORDER — BISACODYL 10 MG RE SUPP: 10.0000 mg | Freq: Every day | RECTAL | Status: DC | PRN

## 2019-06-05 MED ORDER — SODIUM CHLORIDE 0.9 % IV SOLN: 200.0000 mg | Freq: Once | INTRAVENOUS | Status: DC

## 2019-06-05 MED ORDER — ACETAMINOPHEN 325 MG PO TABS: 650.0000 mg | ORAL_TABLET | Freq: Four times a day (QID) | ORAL | Status: DC | PRN

## 2019-06-05 MED ORDER — VITAMIN C 500 MG PO TABS
1000.0000 mg | ORAL_TABLET | Freq: Every day | ORAL | Status: DC
  Filled 2019-06-05: qty 2

## 2019-06-05 MED ORDER — APIXABAN 5 MG PO TABS: 5.0000 mg | ORAL_TABLET | Freq: Two times a day (BID) | ORAL | Status: DC

## 2019-06-05 MED ORDER — VITAMIN C 500 MG PO TABS
500.0000 mg | ORAL_TABLET | Freq: Every day | ORAL | Status: DC
  Administered 2019-06-05 – 2019-06-07 (×3): 500 mg via ORAL
  Filled 2019-06-05 (×3): qty 1

## 2019-06-05 MED ORDER — METOPROLOL SUCCINATE ER 50 MG PO TB24: 25.0000 mg | ORAL_TABLET | Freq: Every day | ORAL | Status: DC

## 2019-06-05 MED ORDER — DEXAMETHASONE SODIUM PHOSPHATE 10 MG/ML IJ SOLN: 6.0000 mg | INTRAMUSCULAR | Status: DC

## 2019-06-05 MED ORDER — ONDANSETRON HCL 4 MG PO TABS: 4.0000 mg | ORAL_TABLET | Freq: Four times a day (QID) | ORAL | Status: DC | PRN

## 2019-06-05 MED ORDER — SODIUM CHLORIDE 0.9 % IV SOLN: 100.0000 mg | Freq: Every day | INTRAVENOUS | Status: DC

## 2019-06-05 MED ORDER — POTASSIUM CHLORIDE 10 MEQ/100ML IV SOLN
10.0000 meq | INTRAVENOUS | Status: DC
  Administered 2019-06-05: 10 meq via INTRAVENOUS
  Filled 2019-06-05 (×4): qty 100

## 2019-06-05 MED ORDER — SODIUM CHLORIDE 0.9 % IV SOLN
100.0000 mg | Freq: Every day | INTRAVENOUS | Status: DC
  Administered 2019-06-06: 100 mg via INTRAVENOUS
  Filled 2019-06-05 (×2): qty 20

## 2019-06-05 MED ORDER — SODIUM CHLORIDE 0.9 % IV BOLUS
1000.0000 mL | Freq: Once | INTRAVENOUS | Status: AC
  Administered 2019-06-05: 1000 mL via INTRAVENOUS

## 2019-06-05 MED ORDER — VITAMIN D 25 MCG (1000 UNIT) PO TABS: 1000.0000 [IU] | ORAL_TABLET | Freq: Every day | ORAL | Status: DC

## 2019-06-05 MED ORDER — ENOXAPARIN SODIUM 40 MG/0.4ML ~~LOC~~ SOLN
40.0000 mg | Freq: Every day | SUBCUTANEOUS | Status: DC
  Administered 2019-06-05 – 2019-06-07 (×3): 40 mg via SUBCUTANEOUS
  Filled 2019-06-05 (×3): qty 0.4

## 2019-06-05 MED ORDER — FAMOTIDINE 20 MG PO TABS: 20.0000 mg | ORAL_TABLET | Freq: Two times a day (BID) | ORAL | Status: DC

## 2019-06-05 MED ORDER — ONDANSETRON HCL 4 MG/2ML IJ SOLN: 4.0000 mg | Freq: Four times a day (QID) | INTRAMUSCULAR | Status: DC | PRN

## 2019-06-05 MED ORDER — DEXAMETHASONE 4 MG PO TABS
6.0000 mg | ORAL_TABLET | ORAL | Status: DC
  Filled 2019-06-05: qty 1.5

## 2019-06-05 MED ORDER — POTASSIUM CHLORIDE 10 MEQ/100ML IV SOLN
10.0000 meq | INTRAVENOUS | Status: AC
  Administered 2019-06-05 (×2): 10 meq via INTRAVENOUS

## 2019-06-05 MED ORDER — ENOXAPARIN SODIUM 80 MG/0.8ML ~~LOC~~ SOLN: 1.0000 mg/kg | Freq: Two times a day (BID) | SUBCUTANEOUS | Status: DC

## 2019-06-05 MED ORDER — ZINC SULFATE 220 (50 ZN) MG PO CAPS
220.0000 mg | ORAL_CAPSULE | Freq: Every day | ORAL | Status: DC
  Filled 2019-06-05: qty 1

## 2019-06-05 MED ORDER — POTASSIUM CHLORIDE CRYS ER 20 MEQ PO TBCR
40.0000 meq | EXTENDED_RELEASE_TABLET | Freq: Once | ORAL | Status: AC
  Administered 2019-06-05: 40 meq via ORAL
  Filled 2019-06-05: qty 2

## 2019-06-05 MED ORDER — IOHEXOL 350 MG/ML SOLN
75.0000 mL | Freq: Once | INTRAVENOUS | Status: AC | PRN
  Administered 2019-06-05: 75 mL via INTRAVENOUS

## 2019-06-05 MED ORDER — SODIUM CHLORIDE 0.9 % IV SOLN
200.0000 mg | Freq: Once | INTRAVENOUS | Status: AC
  Administered 2019-06-05: 200 mg via INTRAVENOUS
  Filled 2019-06-05: qty 40

## 2019-06-05 MED ORDER — GUAIFENESIN-DM 100-10 MG/5ML PO SYRP
10.0000 mL | ORAL_SOLUTION | ORAL | Status: DC | PRN
  Filled 2019-06-05: qty 10

## 2019-06-05 MED ORDER — ONDANSETRON HCL 4 MG/2ML IJ SOLN
4.0000 mg | Freq: Once | INTRAMUSCULAR | Status: AC
  Administered 2019-06-05: 4 mg via INTRAVENOUS
  Filled 2019-06-05: qty 2

## 2019-06-05 MED ORDER — ENOXAPARIN SODIUM 40 MG/0.4ML ~~LOC~~ SOLN: 40.0000 mg | SUBCUTANEOUS | Status: DC

## 2019-06-05 MED ORDER — HYDROCOD POLST-CPM POLST ER 10-8 MG/5ML PO SUER: 5.0000 mL | Freq: Two times a day (BID) | ORAL | Status: DC | PRN

## 2019-06-05 MED ORDER — PANTOPRAZOLE SODIUM 40 MG PO TBEC: 40.0000 mg | DELAYED_RELEASE_TABLET | Freq: Every day | ORAL | Status: DC

## 2019-06-05 MED ORDER — MAGNESIUM CITRATE PO SOLN: 1.0000 | Freq: Once | ORAL | Status: DC | PRN

## 2019-06-05 MED ORDER — METOPROLOL SUCCINATE ER 25 MG PO TB24
25.0000 mg | ORAL_TABLET | Freq: Every day | ORAL | Status: DC
  Administered 2019-06-06: 25 mg via ORAL
  Filled 2019-06-05: qty 1

## 2019-06-05 MED ORDER — MAGNESIUM HYDROXIDE 400 MG/5ML PO SUSP: 30.0000 mL | Freq: Every day | ORAL | Status: DC | PRN

## 2019-06-05 MED ORDER — ASPIRIN EC 325 MG PO TBEC: 325.0000 mg | DELAYED_RELEASE_TABLET | Freq: Every day | ORAL | Status: DC

## 2019-06-05 MED ORDER — ZINC SULFATE 220 (50 ZN) MG PO CAPS
220.0000 mg | ORAL_CAPSULE | Freq: Every day | ORAL | Status: DC
  Administered 2019-06-05 – 2019-06-07 (×3): 220 mg via ORAL
  Filled 2019-06-05 (×3): qty 1

## 2019-06-05 MED ORDER — DEXAMETHASONE SODIUM PHOSPHATE 10 MG/ML IJ SOLN
10.0000 mg | Freq: Once | INTRAMUSCULAR | Status: AC
  Administered 2019-06-05: 10 mg via INTRAVENOUS

## 2019-06-05 MED ORDER — TRAZODONE HCL 50 MG PO TABS: 25.0000 mg | ORAL_TABLET | Freq: Every evening | ORAL | Status: DC | PRN

## 2019-06-05 MED ORDER — POLYETHYLENE GLYCOL 3350 17 G PO PACK: 17.0000 g | PACK | Freq: Every day | ORAL | Status: DC | PRN

## 2019-06-05 NOTE — ED Notes (Signed)
Patient's husband was dx with COVID a little over a week ago. Patient states she has been in a different part of the house as her husband but not quarantined

## 2019-06-05 NOTE — H&P (Signed)
[facility] [place] NAME: Shelby Craig    MR#:  [id number]  DATE OF BIRTH:  29-Jan-1951  DATE OF ADMISSION:  06/05/2019  PRIMARY CARE PHYSICIAN: Hortencia Pilar, MD   REQUESTING/REFERRING PHYSICIAN: Marjean Donna, MD  CHIEF COMPLAINT:   Chief Complaint  Patient presents with  . Shortness of Breath    HISTORY OF PRESENT ILLNESS:  Shelby Craig  is a 68 y.o. Caucasian female with a known history of atrial fibrillation and GERD, presented to the emergency room with acute onset of worsening dyspnea since last night.  She has been having mild cough over the last couple of days occasionally productive of clear sputum and admits to wheezing.  She admitted to fever with a T-max of 99.8 as well as chills.  She has been having generalized weakness without body aches.  She admits to nausea and vomiting but denied any diarrhea.  No loss of taste or smell.  She has not eaten much however for the last week.  No dysuria, oliguria or hematuria or flank pain.  Upon  presentation to the emergency room, blood pressure was 99/71 with a respiratory rate 21 with a pulse oximetry that was 6% on room air however dropped to 89% on ambulation with improvement to 97% on 2 L of O2 by nasal cannula.  Labs revealed mild hypokalemia.  Lactic acid was 1.5.  D-dimer was 867.96.  EKG showed atrial fibrillation with rapid ventricle response of 111 with prolonged QT level (QTC 507 MS).  Portable chest ray showed low lung volumes with patchy bibasal airspace disease, left greater than right concerning for multifocal pneumonia or less likely atelectasis.  Covid-19 rapid antigen test came back negative  The patient was given 10 mg of IV Decadron, 4 mg IV Zofran and 1 L bolus of IV normal saline.  She will be admitted to a medical monitored isolation bed for further evaluation and management. PAST MEDICAL HISTORY:   Past Medical History:  Diagnosis Date  . Anemia   . Chronic UTI   . Depression    . Esophageal stricture   . H/O gastroesophageal reflux (GERD)   . History of esophageal stricture   . Vitamin D deficiency   Chronic atrial fibrillation PAST SURGICAL HISTORY:   Past Surgical History:  Procedure Laterality Date  . COLONOSCOPY  2006  . ENDOMETRIAL ABLATION    . TUBAL LIGATION    . UPPER GASTROINTESTINAL ENDOSCOPY  01/01/2013, 11/24/2004    SOCIAL HISTORY:   Social History   Tobacco Use  . Smoking status: Former Research scientist (life sciences)  . Smokeless tobacco: Never Used  Substance Use Topics  . Alcohol use: No    FAMILY HISTORY:   Family History  Problem Relation Age of Onset  . Cancer Mother   . Cancer Father   . Multiple myeloma Father        multiple myeloma, agent orange exposure  . Breast cancer Maternal Aunt   . HIV Brother   . Brain cancer Paternal Grandmother     DRUG ALLERGIES:   Allergies  Allergen Reactions  . Codeine Other (See Comments)    headaches   . Wasp Venom Protein Swelling    REVIEW OF SYSTEMS:   ROS As per history of present illness. All pertinent systems were reviewed above. Constitutional,  HEENT, cardiovascular, respiratory, GI, GU, musculoskeletal, neuro, psychiatric, endocrine,  integumentary and hematologic systems were reviewed and are otherwise  negative/unremarkable except for positive findings mentioned above in the HPI.  MEDICATIONS AT HOME:   Prior to Admission medications   Medication Sig Start Date End Date Taking? Authorizing Provider  metoprolol succinate (TOPROL XL) 25 MG 24 hr tablet Take 1 tablet (25 mg total) by mouth daily. 03/19/19 03/18/20  Nena Polio, MD  omeprazole (PRILOSEC) 20 MG capsule Take 20 mg by mouth daily.    [provider]      VITAL SIGNS:  Blood pressure 101/71, pulse 96, temperature 98.7 F (37.1 C), temperature source Oral, resp. rate 15, height '5\' 4"'$  (1.626 m), weight 79.4 kg, SpO2 97 %.  PHYSICAL EXAMINATION:  Physical Exam  GENERAL:  68 y.o.-year-old Caucasian female  patient lying in the bed with no acute distress.  EYES: Pupils equal, round, reactive to light and accommodation. No scleral icterus. Extraocular muscles intact.  HEENT: Head atraumatic, normocephalic. Oropharynx and nasopharynx clear.  NECK:  Supple, no jugular venous distention. No thyroid enlargement, no tenderness.  LUNGS: Normal breath sounds bilaterally, no wheezing, rales,rhonchi or crepitation. No use of accessory muscles of respiration.  CARDIOVASCULAR: Irregularly irregular rhythm, S1, S2 normal. No murmurs, rubs, or gallops.  ABDOMEN: Soft, nondistended, nontender. Bowel sounds present. No organomegaly or mass.  EXTREMITIES: No pedal edema, cyanosis, or clubbing.  NEUROLOGIC: Cranial nerves II through XII are intact. Muscle strength 5/5 in all extremities. Sensation intact. Gait not checked.  PSYCHIATRIC: The patient is alert and oriented x 3.  Normal affect and good eye contact. SKIN: No obvious rash, lesion, or ulcer.   LABORATORY PANEL:   CBC Recent Labs  Lab 06/05/19 0425  WBC 4.1  HGB 13.9  HCT 41.9  PLT 165   ------------------------------------------------------------------------------------------------------------------  Chemistries  Recent Labs  Lab 06/05/19 0425  NA 136  K 3.3*  CL 106  CO2 20*  GLUCOSE 127*  BUN 11  CREATININE 0.72  CALCIUM 8.4*  AST 27  ALT 22  ALKPHOS 54  BILITOT 0.8   ------------------------------------------------------------------------------------------------------------------  Cardiac Enzymes No results for input(s): TROPONINI in the last 168 hours. ------------------------------------------------------------------------------------------------------------------  RADIOLOGY:  Dg Chest Port 1 View  Result Date: 06/05/2019 CLINICAL DATA:  New onset of shortness of breath and nausea. Fever. Hypoxia. Patient states that her husband was diagnosed with COVID 1 week ago. EXAM: PORTABLE CHEST 1 VIEW COMPARISON:  One-view chest  x-ray 03/19/2019 FINDINGS: The heart size is exaggerated by low lung volumes. Patchy bibasilar airspace disease is more prominent left than right. IMPRESSION: 1. Low lung volumes with patchy bibasilar airspace disease, left greater than right. In the setting of COVID, this is most concerning for multifocal pneumonia. Atelectasis is considered less likely. Electronically Signed   By: San Morelle M.D.   On: 06/05/2019 05:20      IMPRESSION AND PLAN:   1.  COVID-19 with hypoxemia associated with multifocal pneumonia.  The patient will be admitted to medically monitored isolation bed.  She will be placed on IV Rocephin and Zithromax pending blood and sputum culture.  Antitussives will be provided.  We will continue steroid therapy with IV Decadron.  Given elevated D-dimer, the patient will be placed on p.o. Eliquis especially given her atrial fibrillation and will obtain a chest CTA a.m.  We will place the patient on vitamin D3, vitamin C, zinc, aspirin and Pepcid.  I offered the patient convalescent plasma and she desires to think about it and will let us know later.  O2 protocol will be followed.  She is currently on 2 L of O2 maintaining adequate O2 sats.  2.  Atrial  fibrillation with rapid ventricle response.  We will continue her Toprol XL.  Heart rate is currently controlled.  Her CHA2DS2-VASc score is 2.  We will place her on p.o. Eliquis.  3.  GERD.  We will continue PPI therapy.  4.  DVT prophylaxis.  Subtenons Lovenox.  GI prophylaxis was addressed above with PPI especially given steroid therapy and anticoagulation  All the records are reviewed and case discussed with ED provider. The plan of care was discussed in details with the patient (and family). I answered all questions. The patient agreed to proceed with the above mentioned plan. Further management will depend upon hospital course.   CODE STATUS: This was discussed with the patient and she clearly indicated that she desires  to be DNR/DNI.  TOTAL TIME TAKING CARE OF THIS PATIENT: 55 minutes.    Christel Mormon M.D on 06/05/2019 at 5:48 AM  [facility]   From 7 PM-7 AM, contact night-coverage [email or web address]  CC: Primary care physician; Hortencia Pilar, MD   Note: This dictation was prepared with Dragon dictation along with smaller phrase technology. Any transcriptional errors that result from this process are unintentional.

## 2019-06-05 NOTE — Evaluation (Signed)
Occupational Therapy Evaluation Patient Details Name: Shelby Craig MRN: [id number] DOB: 07-18-1950 Today's Date: 06/05/2019    History of Present Illness Pt is a 68 y/o female with PMHx including Afib, GERD, who presented to ED with acute onset of severe dyspnea. CXR noted patchy bibasilar airspace disease left greater than right.  Her SARS-CoV-2 antigen test was positive.   Clinical Impression   This 68 y/o female presents with the above. PTA pt reports independence with ADL, iADL and functional mobility. Pt performing functional transfers and completing room level mobility (x2 laps) without AD at overall supervision level, no overt LOB noted. She currently requires supervision- minguard assist for ADL tasks. Pt on RA with SpO2 >/=96% throughout. Issued energy conservation and educated/reviewed activity progression and activity progression during ADL completion after return home. Pt reports plans to return home with spouse assist. Will continue to follow while pt remains in acute setting to maximize her safety and independence with ADL and mobility. Will follow.     Follow Up Recommendations  No OT follow up;Supervision - Intermittent    Equipment Recommendations  None recommended by OT           Precautions / Restrictions Precautions Precautions: None Restrictions Weight Bearing Restrictions: No      Mobility Bed Mobility Overal bed mobility: Modified Independent                Transfers Overall transfer level: Modified independent Equipment used: None                  Balance Overall balance assessment: Mild deficits observed, not formally tested                                         ADL either performed or assessed with clinical judgement   ADL Overall ADL's : Needs assistance/impaired Eating/Feeding: Modified independent;Sitting   Grooming: Standing;Supervision/safety   Upper Body Bathing: Sitting;Modified independent    Lower Body Bathing: Min guard;Sit to/from stand   Upper Body Dressing : Modified independent;Sitting   Lower Body Dressing: Min guard;Sit to/from stand   Toilet Transfer: Supervision/safety;Ambulation   Toileting- Clothing Manipulation and Hygiene: Supervision/safety;Sit to/from Nurse, children's Details (indicate cue type and reason): discussed using built in shower seat for increased safety and energy conservation Functional mobility during ADLs: Supervision/safety General ADL Comments: pt with mild DOE (grossly 1/4) , issued EC handout and reviewed     Vision         Perception     Praxis      Pertinent Vitals/Pain Pain Assessment: No/denies pain     Hand Dominance     Extremity/Trunk Assessment Upper Extremity Assessment Upper Extremity Assessment: Overall WFL for tasks assessed   Lower Extremity Assessment Lower Extremity Assessment: Overall WFL for tasks assessed;Defer to PT evaluation   Cervical / Trunk Assessment Cervical / Trunk Assessment: Normal   Communication Communication Communication: No difficulties   Cognition Arousal/Alertness: Awake/alert Behavior During Therapy: WFL for tasks assessed/performed Overall Cognitive Status: Within Functional Limits for tasks assessed                                     General Comments       Exercises     Shoulder Instructions      Home Living  Family/patient expects to be discharged to:: Private residence Living Arrangements: Spouse/significant other Available Help at Discharge: Family Type of Home: House Home Access: Stairs to enter [employer] of Steps: 5 Entrance Stairs-Rails: Left;Right Home Layout: Two level;Able to live on main level with bedroom/bathroom     Bathroom Shower/Tub: Occupational psychologist: (comfort )     Home Equipment: Shower seat - built in          Prior Functioning/Environment Level of Independence: Independent         Comments: driving, working Psychologist, forensic)        OT Problem List: Decreased activity tolerance;Cardiopulmonary status limiting activity;Decreased knowledge of use of DME or AE;Impaired balance (sitting and/or standing);Decreased strength      OT Treatment/Interventions: Self-care/ADL training;Therapeutic exercise;Energy conservation;DME and/or AE instruction;Therapeutic activities;Patient/family education;Balance training    OT Goals(Current goals can be found in the care plan section) Acute Rehab OT Goals Patient Stated Goal: home OT Goal Formulation: With patient Time For Goal Achievement: 06/19/19 Potential to Achieve Goals: Good  OT Frequency: Min 2X/week   Barriers to D/C:            Co-evaluation              AM-PAC OT "6 Clicks" Daily Activity     Outcome Measure Help from another person eating meals?: None Help from another person taking care of personal grooming?: None Help from another person toileting, which includes using toliet, bedpan, or urinal?: None Help from another person bathing (including washing, rinsing, drying)?: A Little Help from another person to put on and taking off regular upper body clothing?: None Help from another person to put on and taking off regular lower body clothing?: A Little 6 Click Score: 22   End of Session Nurse Communication: Mobility status  Activity Tolerance: Patient tolerated treatment well Patient left: in bed;with call bell/phone within reach  OT Visit Diagnosis: Muscle weakness (generalized) (M62.81);Other (comment)(decreased activity tolerance)                Time: 8250-0370 OT Time Calculation (min): 18 min Charges:  OT General Charges $OT Visit: 1 Visit OT Evaluation $OT Eval Moderate Complexity: 1 Mod  Lou Cal, OT [employer] Pager [phone] Office [place] 06/05/2019, 4:44 PM

## 2019-06-05 NOTE — ED Notes (Signed)
Patient dropped to 89% on room air with ambulation

## 2019-06-05 NOTE — H&P (Addendum)
History and Physical    Shelby Craig IOM:[id number] DOB: [birth year] DOA: 06/05/2019  PCP: Hortencia Pilar, MD Patient coming from: [facility] ED   Chief Complaint: Shortness of breath  HPI: 68 year old with a history of atrial fibrillation and GERD who presented to the ED early in the morning with the acute onset of severe dyspnea.  This had been preceded by a few days of mild cough, intermittent wheezing, and generalized weakness with diffuse body aches, nausea and minimal vomiting, but no diarrhea.  In the ED she was found to be hypotensive at 99/71 with oxygen saturation dropping below 90% with ambulation.  EKG noted atrial fibrillation with RVR at 111.  CXR noted patchy bibasilar airspace disease left greater than right.  Her SARS-CoV-2 antigen test was positive.  Assessment/Plan  COVID Pneumonia  Continue remdesivir -we will not dose patient with Decadron at this time as she is not hypoxic (per the Recovery trial) - stabilizing at this time therefore no indication for other therapies presently -monitor closely and if worsens consider Actemra and/or convalescent plasma  Recent Labs  Lab 06/05/19 0425 06/05/19 0529  FERRITIN 104  --   CRP  --  1.8*  ALT 22  --   PROCALCITON <0.10  --     Chronic Afib w/ acute RVR Is not clear to me why the patient is not on chronic anticoagulation -continue her home Toprol-XL dose - CHA2DS2-VASc is 2 -Eliquis initiated  Hypokalemia  Due to poor intake -supplement to goal of 4.0 and follow  GERD with history of esophageal stricture Continue home PPI dosing  Hyperglycemia No noted history of diabetes -likely simply due to Decadron dosing -check A1c in a.m.  DVT prophylaxis: Eliquis Code Status: LIMITED - DO NOT INTUBATE - pt has clarified that she would not want to be intubated/mechanically ventillated, but would wish to undergo CPR, ACLS otherwise    Disposition Plan: Admitted to telemetry bed Consults called: None appropriate  Review of  Systems: As per HPI otherwise 10 point review of systems negative.   Past Medical History:  Diagnosis Date  . Anemia   . Chronic UTI   . Depression   . Esophageal stricture   . H/O gastroesophageal reflux (GERD)   . History of esophageal stricture   . Vitamin D deficiency     Past Surgical History:  Procedure Laterality Date  . COLONOSCOPY  2006  . ENDOMETRIAL ABLATION    . TUBAL LIGATION    . UPPER GASTROINTESTINAL ENDOSCOPY  01/01/2013, 11/24/2004    Family History  Family History  Problem Relation Age of Onset  . Cancer Mother   . Cancer Father   . Multiple myeloma Father        multiple myeloma, agent orange exposure  . Breast cancer Maternal Aunt   . HIV Brother   . Brain cancer Paternal Grandmother     Social History   reports that she has quit smoking. She has never used smokeless tobacco. She reports that she does not drink alcohol or use drugs.  Allergies Allergies  Allergen Reactions  . Codeine Other (See Comments)    headaches   . Wasp Venom Protein Swelling    Prior to Admission medications   Medication Sig Start Date End Date Taking? Authorizing Provider  metoprolol succinate (TOPROL XL) 25 MG 24 hr tablet Take 1 tablet (25 mg total) by mouth daily. 03/19/19 03/18/20  Nena Polio, MD  omeprazole (PRILOSEC) 20 MG capsule Take 20 mg by mouth daily.  [provider]    Physical Exam: Vitals:   06/05/19 1137  BP: 108/66  Resp: 20  TempSrc: Oral  SpO2: 98%   General: No acute respiratory distress Lungs: Clear to auscultation bilaterally without wheezes or crackles Cardiovascular: Irregularly irregular with rate controlled without murmur or rub appreciable Abdomen: Nontender, nondistended, soft, bowel sounds positive, no rebound, no ascites, no appreciable mass Extremities: No significant cyanosis, clubbing, or edema bilateral lower extremities  Labs on Admission:   CBC: Recent Labs  Lab 06/05/19 0425  WBC 4.1  NEUTROABS 2.3   HGB 13.9  HCT 41.9  MCV 84.1  PLT 675   Basic Metabolic Panel: Recent Labs  Lab 06/05/19 0425  NA 136  K 3.3*  CL 106  CO2 20*  GLUCOSE 127*  BUN 11  CREATININE 0.72  CALCIUM 8.4*   GFR: Estimated Creatinine Clearance: 70.5 mL/min (by C-G formula based on SCr of 0.72 mg/dL).   Liver Function Tests: Recent Labs  Lab 06/05/19 0425  AST 27  ALT 22  ALKPHOS 54  BILITOT 0.8  PROT 7.7  ALBUMIN 3.9   Lipid Profile: Recent Labs    06/05/19 0425  TRIG 67   Anemia Panel: Recent Labs    06/05/19 0425  FERRITIN 104   Urine analysis:    Component Value Date/Time   COLORURINE STRAW (A) 03/19/2019 1007   APPEARANCEUR CLEAR (A) 03/19/2019 1007   LABSPEC 1.003 (L) 03/19/2019 1007   PHURINE 7.0 03/19/2019 1007   GLUCOSEU NEGATIVE 03/19/2019 1007   HGBUR NEGATIVE 03/19/2019 1007   BILIRUBINUR NEGATIVE 03/19/2019 1007   KETONESUR NEGATIVE 03/19/2019 1007   PROTEINUR NEGATIVE 03/19/2019 1007   NITRITE NEGATIVE 03/19/2019 1007   LEUKOCYTESUR NEGATIVE 03/19/2019 1007    Radiological Exams on Admission: Ct Angio Chest Pe W Or Wo Contrast  Result Date: 06/05/2019 CLINICAL DATA:  Worsening shortness of breath since last evening. EXAM: CT ANGIOGRAPHY CHEST WITH CONTRAST TECHNIQUE: Multidetector CT imaging of the chest was performed using the standard protocol during bolus administration of intravenous contrast. Multiplanar CT image reconstructions and MIPs were obtained to evaluate the vascular anatomy. CONTRAST:  18m OMNIPAQUE IOHEXOL 350 MG/ML SOLN COMPARISON:  Chest x-ray same date. FINDINGS: Cardiovascular: The heart is normal in size. No pericardial effusion. The aorta is normal in caliber. No dissection. No significant atherosclerotic calcifications. No definite coronary artery calcifications. The pulmonary arterial tree is well opacified. No filling defects to suggest pulmonary embolism. Mediastinum/Nodes: Borderline scattered mediastinal and hilar lymph nodes likely  inflammatory/hyperplastic. No mass or overt adenopathy. Small calcified right hilar and mediastinal lymph nodes are noted along with right lower lobe calcified granuloma. Lungs/Pleura: There are patchy bilateral ground-glass infiltrates suspicious for atypical pneumonia such as COVID-19. No pleural effusions or worrisome pulmonary lesions. Upper Abdomen: No significant upper abdominal findings. There is a moderate-sized hiatal hernia noted. There is a partially calcified 18 mm gallstone noted the gallbladder. Small scattered calcified granulomas are noted in the liver and spleen. Musculoskeletal: No breast masses, supraclavicular or axillary adenopathy. The thyroid gland is grossly normal. No significant bony findings. Review of the MIP images confirms the above findings. IMPRESSION: 1. No CT findings for pulmonary embolism. 2. Borderline enlarged inflammatory/hyperplastic mediastinal and bilateral hilar lymph nodes. 3. Normal thoracic aorta. 4. Patchy bilateral ground-glass infiltrates suggesting atypical pneumonia such as COVID-19. 5. Cholelithiasis. Electronically Signed   By: PMarijo SanesM.D.   On: 06/05/2019 08:10   Dg Chest Port 1 View  Result Date: 06/05/2019 CLINICAL DATA:  New  onset of shortness of breath and nausea. Fever. Hypoxia. Patient states that her husband was diagnosed with COVID 1 week ago. EXAM: PORTABLE CHEST 1 VIEW COMPARISON:  One-view chest x-ray 03/19/2019 FINDINGS: The heart size is exaggerated by low lung volumes. Patchy bibasilar airspace disease is more prominent left than right. IMPRESSION: 1. Low lung volumes with patchy bibasilar airspace disease, left greater than right. In the setting of COVID, this is most concerning for multifocal pneumonia. Atelectasis is considered less likely. Electronically Signed   By: San Morelle M.D.   On: 06/05/2019 05:20    Cherene Altes, MD [facility] Office  [phone] Pager - Text Page per Amion as per below:   On-Call/Text Page:      [email or web address]  If 7PM-7AM, please contact night-coverage [email or web address] 06/05/2019, 11:49 AM

## 2019-06-05 NOTE — Consult Note (Signed)
[place] for Eliquis Indication: atrial fibrillation  Allergies  Allergen Reactions  . Codeine Other (See Comments)    headaches   . Wasp Venom Protein Swelling    Patient Measurements: Height: 5\' 4"  (162.6 cm) Weight: 175 lb (79.4 kg) IBW/kg (Calculated) : 54.7  Vital Signs: Temp: 98.7 F (37.1 C) (12/04 0405) Temp Source: Oral (12/04 0405) BP: 107/67 (12/04 0630) Pulse Rate: 93 (12/04 0630)  Labs: Recent Labs    06/05/19 0425  HGB 13.9  HCT 41.9  PLT 165  CREATININE 0.72    Estimated Creatinine Clearance: 70.5 mL/min (by C-G formula based on SCr of 0.72 mg/dL).   Medications:  Pt on metoprolol succinate 25mg  qd pta - no pta anticoagulation  Assessment: 68 y.o. female presents to the emergency department secondary to a 1 day history of progressive fatigue dyspnea cough, nausea and poor p.o. intake.  No listed history of a fib, bu EKG showed atrial fibrillation with rapid ventricle response of 111 with prolonged QT level (QTC 507 MS) and her CHA2DS2-VASc score is 2.  Pharmacy has been consulted to initiate and monitor Eliquis therapy.  Goal of Therapy:  Monitor platelets by anticoagulation protocol: Yes   Plan:  Will start Eliquis 5mg  bid  Will follow CBC/Scr a minimum of every 3 days per protocol.  Lu Duffel, PharmD, BCPS Clinical Pharmacist 06/05/2019 7:30 AM

## 2019-06-05 NOTE — ED Provider Notes (Signed)
[facility] Emergency Department Provider Note  ____________________________________________   First MD Initiated Contact with Patient 06/05/19 0451     (approximate)  I have reviewed the triage vital signs and the nursing notes.   HISTORY  Chief Complaint Shortness of Breath    HPI Margrette Craig is a 68 y.o. female with below list of previous medical conditions presents to the emergency department secondary to a 1 day history of progressive fatigue dyspnea cough, nausea and poor p.o. intake.  Of note the patient's husband tested positive for COVID-19 on November 24.        Past Medical History:  Diagnosis Date   Anemia    Chronic UTI    Depression    Esophageal stricture    H/O gastroesophageal reflux (GERD)    History of esophageal stricture    Vitamin D deficiency     Patient Active Problem List   Diagnosis Date Noted   COVID-19 06/05/2019   Other neutropenia (Roundup) 03/18/2018    Past Surgical History:  Procedure Laterality Date   COLONOSCOPY  2006   ENDOMETRIAL ABLATION     TUBAL LIGATION     UPPER GASTROINTESTINAL ENDOSCOPY  01/01/2013, 11/24/2004    Prior to Admission medications   Medication Sig Start Date End Date Taking? Authorizing Provider  metoprolol succinate (TOPROL XL) 25 MG 24 hr tablet Take 1 tablet (25 mg total) by mouth daily. 03/19/19 03/18/20  Nena Polio, MD  omeprazole (PRILOSEC) 20 MG capsule Take 20 mg by mouth daily.    [provider]    Allergies Codeine and Wasp venom protein  Family History  Problem Relation Age of Onset   Cancer Mother    Cancer Father    Multiple myeloma Father        multiple myeloma, agent orange exposure   Breast cancer Maternal Aunt    HIV Brother    Brain cancer Paternal Grandmother     Social History Social History   Tobacco Use   Smoking status: Former Smoker   Smokeless tobacco: Never Used  Substance Use Topics   Alcohol  use: No   Drug use: Never    Review of Systems Constitutional: No fever/chills.  Positive for fatigue Eyes: No visual changes. ENT: No sore throat. Cardiovascular: Denies chest pain. Respiratory: Positive for dyspnea  Gastrointestinal: No abdominal pain.  Positive for nausea, no vomiting.  No diarrhea.  No constipation. Genitourinary: Negative for dysuria. Musculoskeletal: Negative for neck pain.  Negative for back pain. Integumentary: Negative for rash. Neurological: Negative for headaches, focal weakness or numbness.  ____________________________________________   PHYSICAL EXAM:  VITAL SIGNS: ED Triage Vitals  Enc Vitals Group     BP 06/05/19 0418 99/71     Pulse Rate 06/05/19 0405 85     Resp 06/05/19 0405 20     Temp 06/05/19 0405 98.7 F (37.1 C)     Temp Source 06/05/19 0405 Oral     SpO2 06/05/19 0405 96 %     Weight 06/05/19 0406 79.4 kg (175 lb)     Height 06/05/19 0406 1.626 m ('5\' 4"'$ )     Head Circumference --      Peak Flow --      Pain Score 06/05/19 0405 0     Pain Loc --      Pain Edu? --      Excl. in [place]? --     Constitutional: Alert and oriented.  Eyes: Conjunctivae are normal.  Mouth/Throat:  Patient is wearing a mask. Neck: No stridor.  No meningeal signs.   Cardiovascular: Normal rate, regular rhythm. Good peripheral circulation. Grossly normal heart sounds. Respiratory: Tachypnea.  No retractions. Gastrointestinal: Soft and nontender. No distention.  Musculoskeletal: No lower extremity tenderness nor edema. No gross deformities of extremities. Neurologic:  Normal speech and language. No gross focal neurologic deficits are appreciated.  Skin:  Skin is warm, dry and intact. Psychiatric: Mood and affect are normal. Speech and behavior are normal.  ____________________________________________   LABS (all labs ordered are listed, but only abnormal results are displayed)  Labs Reviewed  COMPREHENSIVE METABOLIC PANEL - Abnormal; Notable for the  following components:      Result Value   Potassium 3.3 (*)    CO2 20 (*)    Glucose, Bld 127 (*)    Calcium 8.4 (*)    All other components within normal limits  FIBRIN DERIVATIVES D-DIMER ([facility] ONLY) - Abnormal; Notable for the following components:   Fibrin derivatives D-dimer ([facility]) 867.96 (*)    All other components within normal limits  CULTURE, BLOOD (ROUTINE X 2)  CULTURE, BLOOD (ROUTINE X 2)  EXPECTORATED SPUTUM ASSESSMENT W REFEX TO RESP CULTURE  CBC WITH DIFFERENTIAL/PLATELET  LACTIC ACID, PLASMA  LACTIC ACID, PLASMA  C-REACTIVE PROTEIN  FERRITIN  FIBRINOGEN  LACTATE DEHYDROGENASE  PROCALCITONIN  TRIGLYCERIDES  HIV ANTIBODY (ROUTINE TESTING W REFLEX)  ABO/RH   ____________________________________________  EKG ED ECG REPORT I, [place] Rylynne Schicker, the attending physician, personally viewed and interpreted this ECG.   Date: 06/05/2019  EKG Time: 4:16 AM  Rate: 111  Rhythm: Atrial fibrillation with rapid ventricular response  Axis: Normal  Intervals: Normal  ST&T Change: None   ____________________________________________  RADIOLOGY I,  N Ivaan Liddy, personally viewed and evaluated these images (plain radiographs) as part of my medical decision making, as well as reviewing the written report by the radiologist.  ED MD interpretation: She bibasilar airspace disease left greater than right concerning for possible multifocal pneumonia  Official radiology report(s): Dg Chest Port 1 View  Result Date: 06/05/2019 CLINICAL DATA:  New onset of shortness of breath and nausea. Fever. Hypoxia. Patient states that her husband was diagnosed with COVID 1 week ago. EXAM: PORTABLE CHEST 1 VIEW COMPARISON:  One-view chest x-ray 03/19/2019 FINDINGS: The heart size is exaggerated by low lung volumes. Patchy bibasilar airspace disease is more prominent left than right. IMPRESSION: 1. Low lung volumes with patchy bibasilar airspace disease, left greater than right. In the setting  of COVID, this is most concerning for multifocal pneumonia. Atelectasis is considered less likely. Electronically Signed   By: San Morelle M.D.   On: 06/05/2019 05:20      Procedures   ____________________________________________   INITIAL IMPRESSION / MDM / [place] / ED COURSE  As part of my medical decision making, I reviewed the following data within the electronic MEDICAL RECORD NUMBER   68 year old female presented with above-stated history and physical exam concerning for COVID-19 infection with hypoxia.  Patient's amatory saturation 89%.  Patient given Decadron 10 mg IV.  Patient discussed with Dr.Mansy for hospital admission for further evaluation and management.      ____________________________________________  FINAL CLINICAL IMPRESSION(S) / ED DIAGNOSES  Final diagnoses:  COVID-19     MEDICATIONS GIVEN DURING THIS VISIT:  Medications  metoprolol succinate (TOPROL-XL) 24 hr tablet 25 mg (has no administration in time range)  pantoprazole (PROTONIX) EC tablet 40 mg (has no administration in time range)  dexamethasone (DECADRON)  tablet 6 mg (has no administration in time range)  guaiFENesin-dextromethorphan (ROBITUSSIN DM) 100-10 MG/5ML syrup 10 mL (has no administration in time range)  chlorpheniramine-HYDROcodone (TUSSIONEX) 10-8 MG/5ML suspension 5 mL (has no administration in time range)  famotidine (PEPCID) tablet 20 mg (has no administration in time range)  acetaminophen (TYLENOL) tablet 650 mg (has no administration in time range)  traZODone (DESYREL) tablet 25 mg (has no administration in time range)  magnesium hydroxide (MILK OF MAGNESIA) suspension 30 mL (has no administration in time range)  ondansetron (ZOFRAN) tablet 4 mg (has no administration in time range)    Or  ondansetron (ZOFRAN) injection 4 mg (has no administration in time range)  aspirin EC tablet 325 mg (has no administration in time range)  vitamin C (ASCORBIC ACID) tablet  1,000 mg (has no administration in time range)  Vitamin D3 (Vitamin D) tablet 1,000 Units (has no administration in time range)  zinc sulfate capsule 220 mg (has no administration in time range)  famotidine (PEPCID) tablet 20 mg (has no administration in time range)  enoxaparin (LOVENOX) injection 80 mg (has no administration in time range)  sodium chloride 0.9 % bolus 1,000 mL (1,000 mLs Intravenous New Bag/Given 06/05/19 0510)  dexamethasone (DECADRON) injection 10 mg (10 mg Intravenous Given 06/05/19 0505)  ondansetron (ZOFRAN) injection 4 mg (4 mg Intravenous Given 06/05/19 0505)     ED Discharge Orders    None      *Please note:  Aireonna Bauer was evaluated in Emergency Department on 06/05/2019 for the symptoms described in the history of present illness. She was evaluated in the context of the global COVID-19 pandemic, which necessitated consideration that the patient might be at risk for infection with the SARS-CoV-2 virus that causes COVID-19. Institutional protocols and algorithms that pertain to the evaluation of patients at risk for COVID-19 are in a state of rapid change based on information released by regulatory bodies including the CDC and federal and state organizations. These policies and algorithms were followed during the patient's care in the ED.  Some ED evaluations and interventions may be delayed as a result of limited staffing during the pandemic.*  Note:  This document was prepared using Dragon voice recognition software and may include unintentional dictation errors.   Gregor Hams, MD 06/05/19 [phone]

## 2019-06-05 NOTE — Discharge Summary (Signed)
Physician Discharge Summary  Patient ID: Twanna Resh MRN: [id number] DOB/AGE: 1951-01-20 68 y.o.  Admit date: 06/05/2019 Discharge date: 06/05/2019  Admission Diagnoses:  Discharge Diagnoses:  Active Problems:   COVID-19   Pneumonia due to COVID-19 virus   Discharged Condition: fair  Hospital Course:  HPI on Admission: Admitted on 06/05/19 AM (past MN) Shelby Craig  is a 68 y.o. Caucasian female with a known history of atrial fibrillation and GERD, presented to the emergency room with acute onset of worsening dyspnea since last night.  She has been having mild cough over the last couple of days occasionally productive of clear sputum and admits to wheezing.  She admitted to fever with a T-max of 99.8 as well as chills.  She has been having generalized weakness without body aches.  She admits to nausea and vomiting but denied any diarrhea.  No loss of taste or smell.  She has not eaten much however for the last week.  No dysuria, oliguria or hematuria or flank pain. Upon  presentation to the emergency room, blood pressure was 99/71 with a respiratory rate 21 with a pulse oximetry that was 6% on room air however dropped to 89% on ambulation with improvement to 97% on 2 L of O2 by nasal cannula.  Labs revealed mild hypokalemia.  Lactic acid was 1.5.  D-dimer was 867.96.  EKG showed atrial fibrillation with rapid ventricle response of 111 with prolonged QT level (QTC 507 MS).  Portable chest ray showed low lung volumes with patchy bibasal airspace disease, left greater than right concerning for multifocal pneumonia or less likely atelectasis.  Covid-19 rapid antigen test came back negative The patient was given 10 mg of IV Decadron, 4 mg IV Zofran and 1 L bolus of IV normal saline.  She will be admitted to a medical monitored isolation bed for further evaluation and management.  1.  COVID-19 with hypoxemia associated with multifocal pneumonia.   - CT-Angio on 06/05/19: 1. No CT findings for  pulmonary embolism. 2. Borderline enlarged inflammatory/hyperplastic mediastinal and bilateral hilar lymph nodes. 3. Normal thoracic aorta. 4. Patchy bilateral ground-glass infiltrates suggesting atypical pneumonia such as COVID-19. The patient will be admitted to medically monitored isolation bed.  She will be placed on IV Rocephin and Zithromax pending blood and sputum culture.  Antitussives will be provided.  We will continue steroid therapy with IV Decadron.  Given elevated D-dimer, the patient will be placed on p.o. Eliquis especially given her atrial fibrillation.  We will place the patient on vitamin D3, vitamin C, zinc, aspirin and Pepcid.  I offered the patient convalescent plasma and she desires to think about it and will let us know later.  O2 protocol will be followed.  She is currently on 2 L of O2 maintaining adequate O2 sats.  2.  Atrial fibrillation with rapid ventricle response.  We will continue her Toprol XL.  Heart rate is currently controlled.  Her CHA2DS2-VASc score is 2.  We will place her on p.o. Eliquis.  3.  GERD.  We will continue PPI therapy.  4.  DVT prophylaxis.  Subtenons Lovenox.  GI prophylaxis was addressed above with PPI especially given steroid therapy and anticoagulation  All the records are reviewed and case discussed with ED provider. The plan of care was discussed in details with the patient (and family). I answered all questions. The patient agreed to proceed with the above mentioned plan. Further management will depend upon hospital course.   CODE STATUS: This was  discussed with the patient and she clearly indicated that she desires to be DNR/DNI.  Consults: None  Significant Diagnostic Studies:   Treatments:  As per hospital course   Discharge Exam: Blood pressure 107/67, pulse 93, temperature 98.7 F (37.1 C), temperature source Oral, resp. rate (!) 25, height 5\' 4"  (1.626 m), weight 79.4 kg, SpO2 100 %.  Unchanged from admission  PEx  Disposition: Discharge disposition: [place] Not Defined      Pt is to be discharged to [facility] for further Covid-19 PNA management and care. Spoke with Tye Maryland at the [facility] who is arranging the transportation.  H&P from admission emailed through secure link as per Cathy's request.   Allergies as of 06/05/2019      Reactions   Codeine Other (See Comments)   headaches   Wasp Venom Protein Swelling      Medication List    TAKE these medications   metoprolol succinate 25 MG 24 hr tablet Commonly known as: Toprol XL Take 1 tablet (25 mg total) by mouth daily.   omeprazole 20 MG capsule Commonly known as: PRILOSEC Take 20 mg by mouth daily.        Signed: Thornell Mule 06/05/2019, 8:25 AM

## 2019-06-05 NOTE — ED Notes (Signed)
Patient transported to CT 

## 2019-06-05 NOTE — Plan of Care (Signed)
  Problem: Education: Goal: Knowledge of risk factors and measures for prevention of condition will improve Outcome: Progressing   Problem: Coping: Goal: Psychosocial and spiritual needs will be supported Outcome: Progressing   Problem: Respiratory: Goal: Will maintain a patent airway Outcome: Progressing   

## 2019-06-05 NOTE — ED Triage Notes (Signed)
Pt in with co shob and nausea since yesterday. States did have a fever at home.

## 2019-06-05 NOTE — Consult Note (Signed)
Remdesivir - Pharmacy Brief Note   O:  ALT: 22 CXR: "Low lung volumes with patchy bibasilar airspace disease, left greater than right. In the setting of COVID, this is most concerning for multifocal pneumonia." SpO2: 89% on room air   A/P:  Start Remdesivir 200 mg IVPB once followed by Remdesivir 100 mg IVPB daily x 4 days.   Pernell Dupre, PharmD, BCPS Clinical Pharmacist 06/05/2019 6:28 AM

## 2019-06-06 ENCOUNTER — Inpatient Hospital Stay (HOSPITAL_COMMUNITY): Payer: BC Managed Care – PPO

## 2019-06-06 DIAGNOSIS — I480 Paroxysmal atrial fibrillation: Secondary | ICD-10-CM

## 2019-06-06 DIAGNOSIS — J1289 Other viral pneumonia: Secondary | ICD-10-CM

## 2019-06-06 DIAGNOSIS — U071 COVID-19: Principal | ICD-10-CM

## 2019-06-06 LAB — CBC WITH DIFFERENTIAL/PLATELET
Abs Immature Granulocytes: 0.01 10*3/uL (ref 0.00–0.07)
Basophils Absolute: 0 10*3/uL (ref 0.0–0.1)
Basophils Relative: 0 %
Eosinophils Absolute: 0 10*3/uL (ref 0.0–0.5)
Eosinophils Relative: 0 %
HCT: 38.6 % (ref 36.0–46.0)
Hemoglobin: 12.4 g/dL (ref 12.0–15.0)
Immature Granulocytes: 0 %
Lymphocytes Relative: 40 %
Lymphs Abs: 1.5 10*3/uL (ref 0.7–4.0)
MCH: 27.9 pg (ref 26.0–34.0)
MCHC: 32.1 g/dL (ref 30.0–36.0)
MCV: 86.9 fL (ref 80.0–100.0)
Monocytes Absolute: 0.4 10*3/uL (ref 0.1–1.0)
Monocytes Relative: 10 %
Neutro Abs: 1.9 10*3/uL (ref 1.7–7.7)
Neutrophils Relative %: 50 %
Platelets: 174 10*3/uL (ref 150–400)
RBC: 4.44 MIL/uL (ref 3.87–5.11)
RDW: 13.2 % (ref 11.5–15.5)
WBC: 3.8 10*3/uL — ABNORMAL LOW (ref 4.0–10.5)
nRBC: 0 % (ref 0.0–0.2)

## 2019-06-06 LAB — COMPREHENSIVE METABOLIC PANEL
ALT: 21 U/L (ref 0–44)
AST: 21 U/L (ref 15–41)
Albumin: 3.1 g/dL — ABNORMAL LOW (ref 3.5–5.0)
Alkaline Phosphatase: 44 U/L (ref 38–126)
Anion gap: 9 (ref 5–15)
BUN: 11 mg/dL (ref 8–23)
CO2: 22 mmol/L (ref 22–32)
Calcium: 8.5 mg/dL — ABNORMAL LOW (ref 8.9–10.3)
Chloride: 112 mmol/L — ABNORMAL HIGH (ref 98–111)
Creatinine, Ser: 0.51 mg/dL (ref 0.44–1.00)
GFR calc Af Amer: >60 mL/min (ref >60)
GFR calc non Af Amer: >60 mL/min (ref >60)
Glucose, Bld: 111 mg/dL — ABNORMAL HIGH (ref 70–99)
Potassium: 3.9 mmol/L (ref 3.5–5.1)
Sodium: 143 mmol/L (ref 135–145)
Total Bilirubin: 0.6 mg/dL (ref 0.3–1.2)
Total Protein: 6.6 g/dL (ref 6.5–8.1)

## 2019-06-06 LAB — D-DIMER, QUANTITATIVE: D-Dimer, Quant: 0.86 ug/mL-FEU — ABNORMAL HIGH (ref 0.00–0.50)

## 2019-06-06 LAB — ABO/RH: ABO/RH(D): A POS

## 2019-06-06 LAB — MAGNESIUM: Magnesium: 1.7 mg/dL (ref 1.7–2.4)

## 2019-06-06 LAB — FERRITIN: Ferritin: 82 ng/mL (ref 11–307)

## 2019-06-06 LAB — C-REACTIVE PROTEIN: CRP: 1.2 mg/dL — ABNORMAL HIGH (ref <1.0)

## 2019-06-06 MED ORDER — MAGNESIUM SULFATE 2 GM/50ML IV SOLN
2.0000 g | Freq: Once | INTRAVENOUS | Status: AC
  Administered 2019-06-06: 2 g via INTRAVENOUS
  Filled 2019-06-06: qty 50

## 2019-06-06 NOTE — Evaluation (Signed)
Physical Therapy Evaluation Patient Details Name: Shelby Craig MRN: [id number] DOB: 11-08-1950 Today's Date: 06/06/2019   History of Present Illness  Pt is a 68 y/o female with PMHx including Afib, GERD, who presented to ED with acute onset of severe dyspnea. CXR noted patchy bibasilar airspace disease left greater than right.  Her SARS-CoV-2 antigen test was positive.  Clinical Impression   Pt admitted with above diagnosis. 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 Pt currently with functional limitations due to the deficits listed below (see PT Problem List). She does well and is able to complete bed mob transfers with mod I, fair balance, ambulated approx 338ft with SBA-mod I and no AD, states fatigue as this is furthest she has ambulated since illness. Pt did well and sats on room air remained in 90s, HR however increased to max 144bpm and RR 36. Pt will benefit from skilled PT while in hospital to increase her activity tolerance, independence and safety with mobility to allow discharge home with spouse. Pt has been educated on importance of mobility and to ambulate with staff as much as she is able to tolerate, she has also been instructed on completing sit<>stands and mobility in room when able.       Follow Up Recommendations No PT follow up    Equipment Recommendations  None recommended by PT    Recommendations for Other Services       Precautions / Restrictions Precautions Precautions: None Restrictions Weight Bearing Restrictions: No      Mobility  Bed Mobility Overal bed mobility: Modified Independent                Transfers Overall transfer level: Modified independent Equipment used: None                Ambulation/Gait Ambulation/Gait assistance: Supervision Gait Distance (Feet): 300 Feet Assistive device: None Gait Pattern/deviations: Step-through pattern     General Gait Details: ambulated with SBA-mod I in hall aprox  355ft on room air with sats in 90s, HR increased to max 144bpm RR 36 max  Stairs            Wheelchair Mobility    Modified Rankin (Stroke Patients Only)       Balance Overall balance assessment: Mild deficits observed, not formally tested                                           Pertinent Vitals/Pain Pain Assessment: No/denies pain    Home Living Family/patient expects to be discharged to:: Private residence Living Arrangements: Spouse/significant other Available Help at Discharge: Family Type of Home: House Home Access: Stairs to enter Entrance Stairs-Rails: Chemical engineer of Steps: 5 Home Layout: Two level;Able to live on main level with bedroom/bathroom Home Equipment: Shower seat - built in      Prior Function Level of Independence: Independent         Comments: driving, working Psychologist, forensic)     Journalist, newspaper        Extremity/Trunk Assessment   Upper Extremity Assessment Upper Extremity Assessment: Overall WFL for tasks assessed    Lower Extremity Assessment Lower Extremity Assessment: Overall WFL for tasks assessed    Cervical / Trunk Assessment Cervical / Trunk Assessment: Normal  Communication   Communication: No difficulties  Cognition Arousal/Alertness: Awake/alert Behavior During Therapy: WFL for tasks assessed/performed Overall Cognitive Status: Within  Functional Limits for tasks assessed                                        General Comments      Exercises Other Exercises Other Exercises: instructed in sit<>stand exercises in room   Assessment/Plan    PT Assessment Patient needs continued PT services  PT Problem List Decreased activity tolerance       PT Treatment Interventions Gait training;Functional mobility training;Therapeutic activities;Therapeutic exercise;Balance training;Neuromuscular re-education    PT Goals (Current goals can be found in the Care  Plan section)  Acute Rehab PT Goals Patient Stated Goal: home Time For Goal Achievement: 06/20/19 Potential to Achieve Goals: Good    Frequency Min 3X/week   Barriers to discharge        Co-evaluation               AM-PAC PT "6 Clicks" Mobility  Outcome Measure Help needed turning from your back to your side while in a flat bed without using bedrails?: None Help needed moving from lying on your back to sitting on the side of a flat bed without using bedrails?: None Help needed moving to and from a bed to a chair (including a wheelchair)?: None Help needed standing up from a chair using your arms (e.g., wheelchair or bedside chair)?: None Help needed to walk in hospital room?: A Little Help needed climbing 3-5 steps with a railing? : A Little 6 Click Score: 22    End of Session   Activity Tolerance: Patient tolerated treatment well Patient left: in bed;with call bell/phone within reach Nurse Communication: Mobility status PT Visit Diagnosis: Other abnormalities of gait and mobility (R26.89)    Time: 7253-6644 PT Time Calculation (min) (ACUTE ONLY): 17 min   Charges:   PT Evaluation $PT Eval Low Complexity: 1 Low          Drema Pry, PT   Freddi Starr 06/06/2019, 1:47 PM

## 2019-06-06 NOTE — Progress Notes (Signed)
Shelby Craig  MAU:[id number] DOB: [birth year] DOA: 06/05/2019 PCP: Hortencia Pilar, MD    Brief Narrative:  68 year old with a history of atrial fibrillation and GERD who presented to the ED early in the morning with the acute onset of severe dyspnea.  This had been preceded by a few days of mild cough, intermittent wheezing, and generalized weakness with diffuse body aches, nausea and minimal vomiting, but no diarrhea.  In the ED she was found to be hypotensive at 99/71 with oxygen saturation dropping below 90% with ambulation.  EKG noted atrial fibrillation with RVR at 111.  CXR noted patchy bibasilar airspace disease left greater than right.  Her SARS-CoV-2 antigen test was positive.  Significant Events: 12/4 admit via [facility] ED - transfer to [facility]  COVID-19 specific Treatment: Remdesivir 12/4 >  Subjective: Resting comfortably in bed.  Able to exert herself without difficulty.  Maintained saturations 88% or better even with ambulation.  No new complaints today  Assessment & Plan:  COVID Pneumonia (confirmed on CT chest) Continue remdesivir -not hypoxic therefore no Decadron- stabilizing at this time therefore no indication for other therapies presently -if remains stable overnight will consider cutting remdesivir course short and discharging patient home 12/6   Recent Labs  Lab 06/05/19 0425 06/05/19 0529 06/06/19 0313  DDIMER  --   --  0.86*  FERRITIN 104  --  82  CRP  --  1.8* 1.2*  ALT 22  --  21  PROCALCITON <0.10  --   --     Chronic Afib w/ acute RVR CHA2DS2-VASc is 2 -patient has only been recently diagnosed with atrial fibrillation and is not yet met with a cardiologist whom she was referred to discuss this issue further -I explained the nature of atrial fibrillation and the risk associated with RVR and stroke -the patient wishes to avoid any medication if possible -I have agreed to monitor her heart rate off Toprol for now as she tells me she has not been taking it at  home -after lengthy discussion concerning the pros and cons of Eliquis use she has agreed to consider this further and we will make a final decision on its use tomorrow -check TSH  Hypokalemia  Corrected with supplementation  Mild hypomagnesemia Likely due to poor oral intake -supplement to goal of 2.0  GERD with history of esophageal stricture Continue home PPI dosing  Hyperglycemia No noted history of diabetes - likely simply due to Decadron dosing - check A1c in a.m.  DVT prophylaxis: lovenox  Code Status: FULL CODE Family Communication:  Disposition Plan: Possible DC home 12/6  Consultants:  none  Antimicrobials:  None  Objective: Blood pressure 121/80, pulse 77, temperature 98.6 F (37 C), temperature source Oral, resp. rate (!) 23, SpO2 95 %.  Intake/Output Summary (Last 24 hours) at 06/06/2019 1004 Last data filed at 06/05/2019 1800 Gross per 24 hour  Intake 440 ml  Output --  Net 440 ml   There were no vitals filed for this visit.  Examination: General: No acute respiratory distress Lungs: Fine scattered pulmonary crackles Cardiovascular: Irregularly irregular with rate controlled without murmur Abdomen: Nontender, nondistended, soft, bowel sounds positive, no rebound, no ascites, no appreciable mass Extremities: No significant cyanosis, clubbing, or edema bilateral lower extremities  CBC: Recent Labs  Lab 06/05/19 0425 06/06/19 0313  WBC 4.1 3.8*  NEUTROABS 2.3 1.9  HGB 13.9 12.4  HCT 41.9 38.6  MCV 84.1 86.9  PLT 165 563   Basic Metabolic Panel: Recent  Labs  Lab 06/05/19 0425 06/06/19 0313  NA 136 143  K 3.3* 3.9  CL 106 112*  CO2 20* 22  GLUCOSE 127* 111*  BUN 11 11  CREATININE 0.72 0.51  CALCIUM 8.4* 8.5*  MG  --  1.7   GFR: Estimated Creatinine Clearance: 70.5 mL/min (by C-G formula based on SCr of 0.51 mg/dL).  Liver Function Tests: Recent Labs  Lab 06/05/19 0425 06/06/19 0313  AST 27 21  ALT 22 21  ALKPHOS 54 44   BILITOT 0.8 0.6  PROT 7.7 6.6  ALBUMIN 3.9 3.1*    Recent Results (from the past 240 hour(s))  Blood Culture (routine x 2)     Status: None (Preliminary result)   Collection Time: 06/05/19  5:29 AM   Specimen: BLOOD  Result Value Ref Range Status   Specimen Description BLOOD LEFT ANTECUBITAL  Final   Special Requests   Final    BOTTLES DRAWN AEROBIC AND ANAEROBIC Blood Culture adequate volume   Culture   Final    NO GROWTH 1 DAY Performed at [facility], [address] [place], [place] [address]    Report Status PENDING  Incomplete  Blood Culture (routine x 2)     Status: None (Preliminary result)   Collection Time: 06/05/19  5:29 AM   Specimen: BLOOD  Result Value Ref Range Status   Specimen Description BLOOD BLOOD RIGHT FOREARM  Final   Special Requests   Final    BOTTLES DRAWN AEROBIC AND ANAEROBIC Blood Culture adequate volume   Culture   Final    NO GROWTH 1 DAY Performed at [facility], [address] [place], [place] [address]    Report Status PENDING  Incomplete     Scheduled Meds:  enoxaparin (LOVENOX) injection  40 mg Subcutaneous Daily   metoprolol succinate  25 mg Oral Daily   pantoprazole  40 mg Oral Daily   vitamin C  500 mg Oral Daily   zinc sulfate  220 mg Oral Daily   Continuous Infusions:  remdesivir 100 mg in NS 100 mL       LOS: 1 day   Cherene Altes, MD [facility] Office  [phone] Pager - Text Page per Amion  If 7PM-7AM, please contact night-coverage per Amion 06/06/2019, 10:04 AM

## 2019-06-07 DIAGNOSIS — I480 Paroxysmal atrial fibrillation: Secondary | ICD-10-CM | POA: Diagnosis present

## 2019-06-07 LAB — COMPREHENSIVE METABOLIC PANEL
ALT: 21 U/L (ref 0–44)
AST: 21 U/L (ref 15–41)
Albumin: 3.2 g/dL — ABNORMAL LOW (ref 3.5–5.0)
Alkaline Phosphatase: 43 U/L (ref 38–126)
Anion gap: 11 (ref 5–15)
BUN: 16 mg/dL (ref 8–23)
CO2: 24 mmol/L (ref 22–32)
Calcium: 8.4 mg/dL — ABNORMAL LOW (ref 8.9–10.3)
Chloride: 106 mmol/L (ref 98–111)
Creatinine, Ser: 0.58 mg/dL (ref 0.44–1.00)
GFR calc Af Amer: >60 mL/min (ref >60)
GFR calc non Af Amer: >60 mL/min (ref >60)
Glucose, Bld: 88 mg/dL (ref 70–99)
Potassium: 3.7 mmol/L (ref 3.5–5.1)
Sodium: 141 mmol/L (ref 135–145)
Total Bilirubin: 0.7 mg/dL (ref 0.3–1.2)
Total Protein: 6.5 g/dL (ref 6.5–8.1)

## 2019-06-07 LAB — CBC WITH DIFFERENTIAL/PLATELET
Abs Immature Granulocytes: 0.01 10*3/uL (ref 0.00–0.07)
Basophils Absolute: 0 10*3/uL (ref 0.0–0.1)
Basophils Relative: 0 %
Eosinophils Absolute: 0 10*3/uL (ref 0.0–0.5)
Eosinophils Relative: 0 %
HCT: 37.8 % (ref 36.0–46.0)
Hemoglobin: 12.3 g/dL (ref 12.0–15.0)
Immature Granulocytes: 0 %
Lymphocytes Relative: 47 %
Lymphs Abs: 2.1 10*3/uL (ref 0.7–4.0)
MCH: 28.5 pg (ref 26.0–34.0)
MCHC: 32.5 g/dL (ref 30.0–36.0)
MCV: 87.7 fL (ref 80.0–100.0)
Monocytes Absolute: 0.5 10*3/uL (ref 0.1–1.0)
Monocytes Relative: 11 %
Neutro Abs: 1.9 10*3/uL (ref 1.7–7.7)
Neutrophils Relative %: 42 %
Platelets: 193 10*3/uL (ref 150–400)
RBC: 4.31 MIL/uL (ref 3.87–5.11)
RDW: 13.4 % (ref 11.5–15.5)
WBC: 4.5 10*3/uL (ref 4.0–10.5)
nRBC: 0 % (ref 0.0–0.2)

## 2019-06-07 LAB — C-REACTIVE PROTEIN: CRP: 0.8 mg/dL (ref <1.0)

## 2019-06-07 LAB — FERRITIN: Ferritin: 82 ng/mL (ref 11–307)

## 2019-06-07 LAB — MAGNESIUM: Magnesium: 1.9 mg/dL (ref 1.7–2.4)

## 2019-06-07 LAB — TSH: TSH: 1.833 u[IU]/mL (ref 0.350–4.500)

## 2019-06-07 LAB — HEMOGLOBIN A1C
Hgb A1c MFr Bld: 6.1 % — ABNORMAL HIGH (ref 4.8–5.6)
Mean Plasma Glucose: 128.37 mg/dL

## 2019-06-07 LAB — D-DIMER, QUANTITATIVE: D-Dimer, Quant: 0.47 ug/mL-FEU (ref 0.00–0.50)

## 2019-06-07 MED ORDER — ACETAMINOPHEN 325 MG PO TABS: 650.0000 mg | ORAL_TABLET | Freq: Four times a day (QID) | ORAL | Status: AC | PRN

## 2019-06-07 MED ORDER — APIXABAN 5 MG PO TABS: 5.0000 mg | ORAL_TABLET | Freq: Two times a day (BID) | ORAL | 0 refills | Status: DC

## 2019-06-07 MED ORDER — SODIUM CHLORIDE 0.9 % IV SOLN
100.0000 mg | Freq: Every day | INTRAVENOUS | Status: DC
  Administered 2019-06-07: 100 mg via INTRAVENOUS
  Filled 2019-06-07: qty 20

## 2019-06-07 NOTE — Discharge Instructions (Signed)
Date of Positive COVID Test: 06/05/2019  Date Quarantine Ends: 06/19/2019   COVID-19 COVID-19 is a respiratory infection that is caused by a virus called severe acute respiratory syndrome coronavirus 2 (SARS-CoV-2). The disease is also known as coronavirus disease or novel coronavirus. In some people, the virus may not cause any symptoms. In others, it may cause a serious infection. The infection can get worse quickly and can lead to complications, such as:  Pneumonia, or infection of the lungs.  Acute respiratory distress syndrome or ARDS. This is fluid build-up in the lungs.  Acute respiratory failure. This is a condition in which there is not enough oxygen passing from the lungs to the body.  Sepsis or septic shock. This is a serious bodily reaction to an infection.  Blood clotting problems.  Secondary infections due to bacteria or fungus. The virus that causes COVID-19 is contagious. This means that it can spread from person to person through droplets from coughs and sneezes (respiratory secretions). What are the causes? This illness is caused by a virus. You may catch the virus by:  Breathing in droplets from an infected person's cough or sneeze.  Touching something, like a table or a doorknob, that was exposed to the virus (contaminated) and then touching your mouth, nose, or eyes. What increases the risk? Risk for infection You are more likely to be infected with this virus if you:  Live in or travel to an area with a COVID-19 outbreak.  Come in contact with a sick person who recently traveled to an area with a COVID-19 outbreak.  Provide care for or live with a person who is infected with COVID-19. Risk for serious illness You are more likely to become seriously ill from the virus if you:  Are 5 years of age or older.  Have a long-term disease that lowers your body's ability to fight infection (immunocompromised).  Live in a nursing home or long-term care  facility.  Have a long-term (chronic) disease such as: ? Chronic lung disease, including chronic obstructive pulmonary disease or asthma ? Heart disease. ? Diabetes. ? Chronic kidney disease. ? Liver disease.  Are obese. What are the signs or symptoms? Symptoms of this condition can range from mild to severe. Symptoms may appear any time from 2 to 14 days after being exposed to the virus. They include:  A fever.  A cough.  Difficulty breathing.  Chills.  Muscle pains.  A sore throat.  Loss of taste or smell. Some people may also have stomach problems, such as nausea, vomiting, or diarrhea. Other people may not have any symptoms of COVID-19. How is this diagnosed? This condition may be diagnosed based on:  Your signs and symptoms, especially if: ? You live in an area with a COVID-19 outbreak. ? You recently traveled to or from an area where the virus is common. ? You provide care for or live with a person who was diagnosed with COVID-19.  A physical exam.  Lab tests, which may include: ? A nasal swab to take a sample of fluid from your nose. ? A throat swab to take a sample of fluid from your throat. ? A sample of mucus from your lungs (sputum). ? Blood tests.  Imaging tests, which may include, X-rays, CT scan, or ultrasound. How is this treated? At present, there is no medicine to treat COVID-19. Medicines that treat other diseases are being used on a trial basis to see if they are effective against COVID-19. Your health care  provider will talk with you about ways to treat your symptoms. For most people, the infection is mild and can be managed at home with rest, fluids, and over-the-counter medicines. Treatment for a serious infection usually takes places in a hospital intensive care unit (ICU). It may include one or more of the following treatments. These treatments are given until your symptoms improve.  Receiving fluids and medicines through an  IV.  Supplemental oxygen. Extra oxygen is given through a tube in the nose, a face mask, or a hood.  Positioning you to lie on your stomach (prone position). This makes it easier for oxygen to get into the lungs.  Continuous positive airway pressure (CPAP) or bi-level positive airway pressure (BPAP) machine. This treatment uses mild air pressure to keep the airways open. A tube that is connected to a motor delivers oxygen to the body.  Ventilator. This treatment moves air into and out of the lungs by using a tube that is placed in your windpipe.  Tracheostomy. This is a procedure to create a hole in the neck so that a breathing tube can be inserted.  Extracorporeal membrane oxygenation (ECMO). This procedure gives the lungs a chance to recover by taking over the functions of the heart and lungs. It supplies oxygen to the body and removes carbon dioxide. Follow these instructions at home: Lifestyle  If you are sick, stay home except to get medical care. Your health care provider will tell you how long to stay home. Call your health care provider before you go for medical care.  Rest at home as told by your health care provider.  Do not use any products that contain nicotine or tobacco, such as cigarettes, e-cigarettes, and chewing tobacco. If you need help quitting, ask your health care provider.  Return to your normal activities as told by your health care provider. Ask your health care provider what activities are safe for you. General instructions  Take over-the-counter and prescription medicines only as told by your health care provider.  Drink enough fluid to keep your urine pale yellow.  Keep all follow-up visits as told by your health care provider. This is important. How is this prevented?  There is no vaccine to help prevent COVID-19 infection. However, there are steps you can take to protect yourself and others from this virus. To protect yourself:   Do not travel to areas  where COVID-19 is a risk. The areas where COVID-19 is reported change often. To identify high-risk areas and travel restrictions, check the CDC travel website: FatFares.com.br  If you live in, or must travel to, an area where COVID-19 is a risk, take precautions to avoid infection. ? Stay away from people who are sick. ? Wash your hands often with soap and water for 20 seconds. If soap and water are not available, use an alcohol-based hand sanitizer. ? Avoid touching your mouth, face, eyes, or nose. ? Avoid going out in public, follow guidance from your state and local health authorities. ? If you must go out in public, wear a cloth face covering or face mask. ? Disinfect objects and surfaces that are frequently touched every day. This may include:  Counters and tables.  Doorknobs and light switches.  Sinks and faucets.  Electronics, such as phones, remote controls, keyboards, computers, and tablets. To protect others: If you have symptoms of COVID-19, take steps to prevent the virus from spreading to others.  If you think you have a COVID-19 infection, contact your health care  provider right away. Tell your health care team that you think you may have a COVID-19 infection.  Stay home. Leave your house only to seek medical care. Do not use public transport.  Do not travel while you are sick.  Wash your hands often with soap and water for 20 seconds. If soap and water are not available, use alcohol-based hand sanitizer.  Stay away from other members of your household. Let healthy household members care for children and pets, if possible. If you have to care for children or pets, wash your hands often and wear a mask. If possible, stay in your own room, separate from others. Use a different bathroom.  Make sure that all people in your household wash their hands well and often.  Cough or sneeze into a tissue or your sleeve or elbow. Do not cough or sneeze into your hand or  into the air.  Wear a cloth face covering or face mask. Where to find more information  Centers for Disease Control and Prevention: PurpleGadgets.be  World Health Organization: https://www.castaneda.info/ Contact a health care provider if:  You live in or have traveled to an area where COVID-19 is a risk and you have symptoms of the infection.  You have had contact with someone who has COVID-19 and you have symptoms of the infection. Get help right away if:  You have trouble breathing.  You have pain or pressure in your chest.  You have confusion.  You have bluish lips and fingernails.  You have difficulty waking from sleep.  You have symptoms that get worse. These symptoms may represent a serious problem that is an emergency. Do not wait to see if the symptoms will go away. Get medical help right away. Call your local emergency services (911 in the [place] Do not drive yourself to the hospital. Let the emergency medical personnel know if you think you have COVID-19. Summary  COVID-19 is a respiratory infection that is caused by a virus. It is also known as coronavirus disease or novel coronavirus. It can cause serious infections, such as pneumonia, acute respiratory distress syndrome, acute respiratory failure, or sepsis.  The virus that causes COVID-19 is contagious. This means that it can spread from person to person through droplets from coughs and sneezes.  You are more likely to develop a serious illness if you are 34 years of age or older, have a weak immunity, live in a nursing home, or have chronic disease.  There is no medicine to treat COVID-19. Your health care provider will talk with you about ways to treat your symptoms.  Take steps to protect yourself and others from infection. Wash your hands often and disinfect objects and surfaces that are frequently touched every day. Stay away from people who are sick and wear a mask if you  are sick. This information is not intended to replace advice given to you by your health care provider. Make sure you discuss any questions you have with your health care provider. Document Released: 07/24/2018 Document Revised: 11/13/2018 Document Reviewed: 07/24/2018 Elsevier Patient Education  2020 [employer].   COVID-19 Frequently Asked Questions COVID-19 (coronavirus disease) is an infection that is caused by a large family of viruses. Some viruses cause illness in people and others cause illness in animals like camels, cats, and bats. In some cases, the viruses that cause illness in animals can spread to humans. Where did the coronavirus come from? In December 2019, [place] told the [facility]) of several  cases of lung disease (human respiratory illness). These cases were linked to an open seafood and livestock market in the [place]. The link to the seafood and livestock market suggests that the virus may have spread from animals to humans. However, since that first outbreak in December, the virus has also been shown to spread from person to person. What is the name of the disease and the virus? Disease name Early on, this disease was called novel coronavirus. This is because scientists determined that the disease was caused by a new (novel) respiratory virus. The World Health Organization [facility](WHO) has now named the disease COVID-19, or coronavirus disease. Virus name The virus that causes the disease is called severe acute respiratory syndrome coronavirus 2 (SARS-CoV-2). More information on disease and virus naming World Health Organization [facility](WHO): www.who.int/emergencies/diseases/novel-coronavirus-2019/technical-guidance/naming-the-coronavirus-disease-(covid-2019)-and-the-virus-that-causes-it Who is at risk for complications from coronavirus disease? Some people may be at higher risk for complications from coronavirus disease. This includes older adults and people who  have chronic diseases, such as heart disease, diabetes, and lung disease. If you are at higher risk for complications, take these extra precautions:  Avoid close contact with people who are sick or have a fever or cough. Stay at least 3-6 ft (1-2 m) away from them, if possible.  Wash your hands often with soap and water for at least 20 seconds.  Avoid touching your face, mouth, nose, or eyes.  Keep supplies on hand at home, such as food, medicine, and cleaning supplies.  Stay home as much as possible.  Avoid social gatherings and travel. How does coronavirus disease spread? The virus that causes coronavirus disease spreads easily from person to person (is contagious). There are also cases of community-spread disease. This means the disease has spread to:  People who have no known contact with other infected people.  People who have not traveled to areas where there are known cases. It appears to spread from one person to another through droplets from coughing or sneezing. Can I get the virus from touching surfaces or objects? There is still a lot that we do not know about the virus that causes coronavirus disease. Scientists are basing a lot of information on what they know about similar viruses, such as:  Viruses cannot generally survive on surfaces for long. They need a human body (host) to survive.  It is more likely that the virus is spread by close contact with people who are sick (direct contact), such as through: ? Shaking hands or hugging. ? Breathing in respiratory droplets that travel through the air. This can happen when an infected person coughs or sneezes on or near other people.  It is less likely that the virus is spread when a person touches a surface or object that has the virus on it (indirect contact). The virus may be able to enter the body if the person touches a surface or object and then touches his or her face, eyes, nose, or mouth. Can a person spread the virus  without having symptoms of the disease? It may be possible for the virus to spread before a person has symptoms of the disease, but this is most likely not the main way the virus is spreading. It is more likely for the virus to spread by being in close contact with people who are sick and breathing in the respiratory droplets of a sick person's cough or sneeze. What are the symptoms of coronavirus disease? Symptoms vary from person to person and can range from  mild to severe. Symptoms may include:  Fever.  Cough.  Tiredness, weakness, or fatigue.  Fast breathing or feeling short of breath. These symptoms can appear anywhere from 2 to 14 days after you have been exposed to the virus. If you develop symptoms, call your health care provider. People with severe symptoms may need hospital care. If I am exposed to the virus, how long does it take before symptoms start? Symptoms of coronavirus disease may appear anywhere from 2 to 14 days after a person has been exposed to the virus. If you develop symptoms, call your health care provider. Should I be tested for this virus? Your health care provider will decide whether to test you based on your symptoms, history of exposure, and your risk factors. How does a health care provider test for this virus? Health care providers will collect samples to send for testing. Samples may include:  Taking a swab of fluid from the nose.  Taking fluid from the lungs by having you cough up mucus (sputum) into a sterile cup.  Taking a blood sample.  Taking a stool or urine sample. Is there a treatment or vaccine for this virus? Currently, there is no vaccine to prevent coronavirus disease. Also, there are no medicines like antibiotics or antivirals to treat the virus. A person who becomes sick is given supportive care, which means rest and fluids. A person may also relieve his or her symptoms by using over-the-counter medicines that treat sneezing, coughing, and  runny nose. These are the same medicines that a person takes for the common cold. If you develop symptoms, call your health care provider. People with severe symptoms may need hospital care. What can I do to protect myself and my family from this virus?     You can protect yourself and your family by taking the same actions that you would take to prevent the spread of other viruses. Take the following actions:  Wash your hands often with soap and water for at least 20 seconds. If soap and water are not available, use alcohol-based hand sanitizer.  Avoid touching your face, mouth, nose, or eyes.  Cough or sneeze into a tissue, sleeve, or elbow. Do not cough or sneeze into your hand or the air. ? If you cough or sneeze into a tissue, throw it away immediately and wash your hands.  Disinfect objects and surfaces that you frequently touch every day.  Avoid close contact with people who are sick or have a fever or cough. Stay at least 3-6 ft (1-2 m) away from them, if possible.  Stay home if you are sick, except to get medical care. Call your health care provider before you get medical care.  Make sure your vaccines are up to date. Ask your health care provider what vaccines you need. What should I do if I need to travel? Follow travel recommendations from your local health authority, the CDC, and WHO. Travel information and advice  Centers for Disease Control and Prevention (CDC): GeminiCard.gl  World Health Organization [facility]): PreviewDomains.se Know the risks and take action to protect your health  You are at higher risk of getting coronavirus disease if you are traveling to areas with an outbreak or if you are exposed to travelers from areas with an outbreak.  Wash your hands often and practice good hygiene to lower the risk of catching or spreading the virus. What should I do if I am  sick? General instructions to stop the spread of infection  Wash your hands often with soap and water for at least 20 seconds. If soap and water are not available, use alcohol-based hand sanitizer.  Cough or sneeze into a tissue, sleeve, or elbow. Do not cough or sneeze into your hand or the air.  If you cough or sneeze into a tissue, throw it away immediately and wash your hands.  Stay home unless you must get medical care. Call your health care provider or local health authority before you get medical care.  Avoid public areas. Do not take public transportation, if possible.  If you can, wear a mask if you must go out of the house or if you are in close contact with someone who is not sick. Keep your home clean  Disinfect objects and surfaces that are frequently touched every day. This may include: ? Counters and tables. ? Doorknobs and light switches. ? Sinks and faucets. ? Electronics such as phones, remote controls, keyboards, computers, and tablets.  Wash dishes in hot, soapy water or use a dishwasher. Air-dry your dishes.  Wash laundry in hot water. Prevent infecting other household members  Let healthy household members care for children and pets, if possible. If you have to care for children or pets, wash your hands often and wear a mask.  Sleep in a different bedroom or bed, if possible.  Do not share personal items, such as razors, toothbrushes, deodorant, combs, brushes, towels, and washcloths. Where to find more information Centers for Disease Control and Prevention (CDC)  Information and news updates: https://www.butler-gonzalez.com/ World Health Organization [facility])  Information and news updates: MissExecutive.com.ee  Coronavirus health topic: https://www.castaneda.info/  Questions and answers on COVID-19: OpportunityDebt.at  Global tracker: who.sprinklr.com American Academy of  Pediatrics (AAP)  Information for families: www.healthychildren.org/English/health-issues/conditions/chest-lungs/Pages/2019-Novel-Coronavirus.aspx The coronavirus situation is changing rapidly. Check your local health authority website or the CDC and [facility] websites for updates and news. When should I contact a health care provider?  Contact your health care provider if you have symptoms of an infection, such as fever or cough, and you: ? Have been near anyone who is known to have coronavirus disease. ? Have come into contact with a person who is suspected to have coronavirus disease. ? Have traveled outside of the country. When should I get emergency medical care?  Get help right away by calling your local emergency services (911 in the [place] if you have: ? Trouble breathing. ? Pain or pressure in your chest. ? Confusion. ? Blue-tinged lips and fingernails. ? Difficulty waking from sleep. ? Symptoms that get worse. Let the emergency medical personnel know if you think you have coronavirus disease. Summary  A new respiratory virus is spreading from person to person and causing COVID-19 (coronavirus disease).  The virus that causes COVID-19 appears to spread easily. It spreads from one person to another through droplets from coughing or sneezing.  Older adults and those with chronic diseases are at higher risk of disease. If you are at higher risk for complications, take extra precautions.  There is currently no vaccine to prevent coronavirus disease. There are no medicines, such as antibiotics or antivirals, to treat the virus.  You can protect yourself and your family by washing your hands often, avoiding touching your face, and covering your coughs and sneezes. This information is not intended to replace advice given to you by your health care provider. Make sure you discuss any questions you have with your health care provider. Document Released: 10/14/2018 Document Revised:  10/14/2018 Document Reviewed: 10/14/2018  Elsevier Patient Education  [employer].  COVID-19: How to Protect Yourself and Others Know how it spreads  There is currently no vaccine to prevent coronavirus disease 2019 (COVID-19).  The best way to prevent illness is to avoid being exposed to this virus.  The virus is thought to spread mainly from person-to-person. ? Between people who are in close contact with one another (within about 6 feet). ? Through respiratory droplets produced when an infected person coughs, sneezes or talks. ? These droplets can land in the mouths or noses of people who are nearby or possibly be inhaled into the lungs. ? Some recent studies have suggested that COVID-19 may be spread by people who are not showing symptoms. Everyone should Clean your hands often  Wash your hands often with soap and water for at least 20 seconds especially after you have been in a public place, or after blowing your nose, coughing, or sneezing.  If soap and water are not readily available, use a hand sanitizer that contains at least 60% alcohol. Cover all surfaces of your hands and rub them together until they feel dry.  Avoid touching your eyes, nose, and mouth with unwashed hands. Avoid close contact  Stay home if you are sick.  Avoid close contact with people who are sick.  Put distance between yourself and other people. ? Remember that some people without symptoms may be able to spread virus. ? This is especially important for people who are at higher risk of getting very RetroStamps.it Cover your mouth and nose with a cloth face cover when around others  You could spread COVID-19 to others even if you do not feel sick.  Everyone should wear a cloth face cover when they have to go out in public, for example to the grocery store or to pick up other necessities. ? Cloth face coverings should not  be placed on young children under age 25, anyone who has trouble breathing, or is unconscious, incapacitated or otherwise unable to remove the mask without assistance.  The cloth face cover is meant to protect other people in case you are infected.  Do NOT use a facemask meant for a Research scientist (physical sciences).  Continue to keep about 6 feet between yourself and others. The cloth face cover is not a substitute for social distancing. Cover coughs and sneezes  If you are in a private setting and do not have on your cloth face covering, remember to always cover your mouth and nose with a tissue when you cough or sneeze or use the inside of your elbow.  Throw used tissues in the trash.  Immediately wash your hands with soap and water for at least 20 seconds. If soap and water are not readily available, clean your hands with a hand sanitizer that contains at least 60% alcohol. Clean and disinfect  Clean AND disinfect frequently touched surfaces daily. This includes tables, doorknobs, light switches, countertops, handles, desks, phones, keyboards, toilets, faucets, and sinks. ktimeonline.com  If surfaces are dirty, clean them: Use detergent or soap and water prior to disinfection.  Then, use a household disinfectant. You can see a list of EPA-registered household disinfectants here. [email or web address] 11/04/2018 This information is not intended to replace advice given to you by your health care provider. Make sure you discuss any questions you have with your health care provider. Document Released: 10/14/2018 Document Revised: 11/12/2018 Document Reviewed: 10/14/2018 Elsevier Patient Education  2020 Elsevier Inc.  Apixaban oral tablets What is this medicine? APIXABAN (a  PIX a ban) is an anticoagulant (blood thinner). It is used to lower the chance of stroke in people with a medical condition called atrial fibrillation. It is also used  to treat or prevent blood clots in the lungs or in the veins. This medicine may be used for other purposes; ask your health care provider or pharmacist if you have questions. COMMON BRAND NAME(S): Eliquis What should I tell my health care provider before I take this medicine? They need to know if you have any of these conditions:  antiphospholipid antibody syndrome  bleeding disorders  bleeding in the brain  blood in your stools (black or tarry stools) or if you have blood in your vomit  history of blood clots  history of stomach bleeding  kidney disease  liver disease  mechanical heart valve  an unusual or allergic reaction to apixaban, other medicines, foods, dyes, or preservatives  pregnant or trying to get pregnant  breast-feeding How should I use this medicine? Take this medicine by mouth with a glass of water. Follow the directions on the prescription label. You can take it with or without food. If it upsets your stomach, take it with food. Take your medicine at regular intervals. Do not take it more often than directed. Do not stop taking except on your doctor's advice. Stopping this medicine may increase your risk of a blood clot. Be sure to refill your prescription before you run out of medicine. Talk to your pediatrician regarding the use of this medicine in children. Special care may be needed. Overdosage: If you think you have taken too much of this medicine contact a poison control center or emergency room at once. NOTE: This medicine is only for you. Do not share this medicine with others. What if I miss a dose? If you miss a dose, take it as soon as you can. If it is almost time for your next dose, take only that dose. Do not take double or extra doses. What may interact with this medicine? This medicine may interact with the following:  aspirin and aspirin-like medicines  certain medicines for fungal infections like ketoconazole and itraconazole  certain  medicines for seizures like carbamazepine and phenytoin  certain medicines that treat or prevent blood clots like warfarin, enoxaparin, and dalteparin  clarithromycin  NSAIDs, medicines for pain and inflammation, like ibuprofen or naproxen  rifampin  ritonavir  St. John's wort This list may not describe all possible interactions. Give your health care provider a list of all the medicines, herbs, non-prescription drugs, or dietary supplements you use. Also tell them if you smoke, drink alcohol, or use illegal drugs. Some items may interact with your medicine. What should I watch for while using this medicine? Visit your healthcare professional for regular checks on your progress. You may need blood work done while you are taking this medicine. Your condition will be monitored carefully while you are receiving this medicine. It is important not to miss any appointments. Avoid sports and activities that might cause injury while you are using this medicine. Severe falls or injuries can cause unseen bleeding. Be careful when using sharp tools or knives. Consider using an Neurosurgeon. Take special care brushing or flossing your teeth. Report any injuries, bruising, or red spots on the skin to your healthcare professional. If you are going to need surgery or other procedure, tell your healthcare professional that you are taking this medicine. Wear a medical ID bracelet or chain. Carry a card that describes your  disease and details of your medicine and dosage times. What side effects may I notice from receiving this medicine? Side effects that you should report to your doctor or health care professional as soon as possible:  allergic reactions like skin rash, itching or hives, swelling of the face, lips, or tongue  signs and symptoms of bleeding such as bloody or black, tarry stools; red or dark-brown urine; spitting up blood or brown material that looks like coffee grounds; red spots on the skin;  unusual bruising or bleeding from the eye, gums, or nose  signs and symptoms of a blood clot such as chest pain; shortness of breath; pain, swelling, or warmth in the leg  signs and symptoms of a stroke such as changes in vision; confusion; trouble speaking or understanding; severe headaches; sudden numbness or weakness of the face, arm or leg; trouble walking; dizziness; loss of coordination This list may not describe all possible side effects. Call your doctor for medical advice about side effects. You may report side effects to FDA at 1-800-FDA-1088. Where should I keep my medicine? Keep out of the reach of children. Store at room temperature between 20 and 25 degrees C (68 and 77 degrees F). Throw away any unused medicine after the expiration date. NOTE: This sheet is a summary. It may not cover all possible information. If you have questions about this medicine, talk to your doctor, pharmacist, or health care provider.  2020 Elsevier/Gold Standard (2018-02-26 17:39:34)

## 2019-06-07 NOTE — Care Management (Addendum)
Pt will discharge home today.  Pt confirms she is independent from home and will transport home via private vehicle.  Pt requested to contact PCP tomorrow and request follow up appt - pt will need to inform office that she is covid positive.  Pt will discharge home on Elqiu[facility] (drug is new for pt).   Pts preferred pharmacy is [facility] in [place] on [address].  CM requested bedside nurse to fax prescription to pharmacy.  Once prescriptions are faxed CM will then call pharmacy to provide both free 30 day and reduced copay benefit.   Update:  Prescription faxed to pharmacy by unit.  CM spoke with pharmacy and provided free 30 day benefit to pharmacy successfully. Pharmacy was unable to take reduced copay information as script requires PA. CM informed attending of PA need [phone].  CM printed paper copy of reduced copay card to unit and requested that bedside nurse provide to pt prior to discharge.  CM reviewed above information with pt.

## 2019-06-07 NOTE — Discharge Summary (Signed)
DISCHARGE SUMMARY  Lexxi Koslow  MR#: [id number]  DOB:[birth year]  Date of Admission: 06/05/2019 Date of Discharge: 06/07/2019  Attending Physician:Davied Nocito Hennie Duos, MD  Patient's WUJ:[id number], Ishmael Holter, MD  Consults: none  Disposition: D/C home   Date of Positive COVID Test: 06/05/2019  Date Quarantine Ends: 06/19/2019  COVID-19 specific Treatment: Remdesivir 12/4 > 12/6  Follow-up Appts: Follow-up Information    Hortencia Pilar, MD Follow up in 1 week(s).   Specialty: Family Medicine Contact information: [place] [address] [phone]           Tests Needing Follow-up: -assess HR control  -determine if pt has decided to begin Eliquis and if so assure she is tolerating it well  Discharge Diagnoses: COVID Pneumonia  ChronicAfib w/acuteRVR Hypokalemia Mild hypomagnesemia GERDwith history of esophageal stricture Hyperglycemia  Initial presentation: 68 year old with a history of atrial fibrillation and GERD who presented to the ED early in the morning with the acute onset of severe dyspnea. This had been preceded by a few days of mild cough,intermittent wheezing,and generalized weakness with diffuse body aches, nausea and minimal vomiting, but no diarrhea. In the ED she was found to be hypotensive at 99/71 with oxygen saturation dropping below 90% with ambulation. EKG noted atrial fibrillation with RVR at 111. CXR noted patchy bibasilar airspace disease left greater than right. Her SARS-CoV-2 antigen test was positive.  Hospital Course:  COVID Pneumonia(confirmed on CT chest) Completed 3 days of remdesivir-not hypoxic therefore no Decadron -stabilized rapdily and never required O2 support- pt and I agree to stopping Remdesivir course short and discharging home today (as per NIH guidelines)  Recent Labs  Lab 06/05/19 0425 06/05/19 0529 06/06/19 0313 06/07/19 0207  DDIMER  --   --  0.86* 0.47  FERRITIN 104  --  82 82   CRP  --  1.8* 1.2* 0.8  ALT 22  --  21 21  PROCALCITON <0.10  --   --   --     ChronicAfib w/acuteRVR CHA2DS2-VASc is 2-patient has only been recently diagnosed with atrial fibrillation and has not yet met with a Cardiologist whom she was referred to discuss this issue -I explained the nature of atrial fibrillation and the risk associated with RVR and stroke -the patient wishes to avoid any medication if possible -I have agreed to monitor her heart rate off Toprol for now as she tells me she has not been taking it at home -after lengthy discussion concerning the pros and cons of Eliquis use she has agreed to consider this further- I am sending her home w/ a Rx for Eliquis - TSH is normal   Hypokalemia Corrected with supplementation  Mild hypomagnesemia Likely due to poor oral intake -supplemented to 1.9  GERDwith history of esophageal stricture Continue home PPI dosing  Hyperglycemia No noted history of diabetes- A1c not c/w DM at 6.1  Allergies as of 06/07/2019      Reactions   Codeine Other (See Comments)   headaches   Wasp Venom Protein Swelling      Medication List    STOP taking these medications   ibuprofen 200 MG tablet Commonly known as: ADVIL     TAKE these medications   acetaminophen 325 MG tablet Commonly known as: TYLENOL Take 2 tablets (650 mg total) by mouth every 6 (six) hours as needed for mild pain or headache (fever >/= 101).   apixaban 5 MG Tabs tablet Commonly known as: Eliquis Take 1 tablet (5 mg total) by mouth  2 (two) times daily.   cholecalciferol 25 MCG (1000 UT) tablet Commonly known as: VITAMIN D3 Take 1,000 Units by mouth daily.   ELDERBERRY PO Take 1 capsule by mouth daily.   metoprolol succinate 25 MG 24 hr tablet Commonly known as: Toprol XL Take 1 tablet (25 mg total) by mouth daily.   omeprazole 20 MG capsule Commonly known as: PRILOSEC Take 20 mg by mouth daily.   zinc gluconate 50 MG tablet Take 50 mg by mouth  daily.       Day of Discharge BP 112/64 (BP Location: Left Arm)   Pulse 65   Temp 98.5 F (36.9 C) (Oral)   Resp 18   Ht '5\' 4"'$  (1.626 m)   Wt 79.4 kg   LMP  (LMP Unknown)   SpO2 95%   BMI 30.04 kg/m   Physical Exam: General: No acute respiratory distress Lungs: Clear to auscultation bilaterally without wheezes or crackles Cardiovascular: Regular rate and rhythm without murmur gallop or rub normal S1 and S2 Abdomen: Nontender, nondistended, soft, bowel sounds positive, no rebound, no ascites, no appreciable mass Extremities: No significant cyanosis, clubbing, or edema bilateral lower extremities  Basic Metabolic Panel: Recent Labs  Lab 06/05/19 0425 06/06/19 0313 06/07/19 0207  NA 136 143 141  K 3.3* 3.9 3.7  CL 106 112* 106  CO2 20* 22 24  GLUCOSE 127* 111* 88  BUN '11 11 16  '$ CREATININE 0.72 0.51 0.58  CALCIUM 8.4* 8.5* 8.4*  MG  --  1.7 1.9    Liver Function Tests: Recent Labs  Lab 06/05/19 0425 06/06/19 0313 06/07/19 0207  AST '27 21 21  '$ ALT '22 21 21  '$ ALKPHOS 54 44 43  BILITOT 0.8 0.6 0.7  PROT 7.7 6.6 6.5  ALBUMIN 3.9 3.1* 3.2*    CBC: Recent Labs  Lab 06/05/19 0425 06/06/19 0313 06/07/19 0207  WBC 4.1 3.8* 4.5  NEUTROABS 2.3 1.9 1.9  HGB 13.9 12.4 12.3  HCT 41.9 38.6 37.8  MCV 84.1 86.9 87.7  PLT 165 174 193     Recent Results (from the past 240 hour(s))  Blood Culture (routine x 2)     Status: None (Preliminary result)   Collection Time: 06/05/19  5:29 AM   Specimen: BLOOD  Result Value Ref Range Status   Specimen Description BLOOD LEFT ANTECUBITAL  Final   Special Requests   Final    BOTTLES DRAWN AEROBIC AND ANAEROBIC Blood Culture adequate volume   Culture   Final    NO GROWTH 2 DAYS Performed at [facility], [address] [place], [place] [address]    Report Status PENDING  Incomplete  Blood Culture (routine x 2)     Status: None (Preliminary result)   Collection Time: 06/05/19  5:29 AM   Specimen: BLOOD   Result Value Ref Range Status   Specimen Description BLOOD BLOOD RIGHT FOREARM  Final   Special Requests   Final    BOTTLES DRAWN AEROBIC AND ANAEROBIC Blood Culture adequate volume   Culture   Final    NO GROWTH 2 DAYS Performed at [facility], [address] [place], [place] [address]    Report Status PENDING  Incomplete      Time spent in discharge (includes decision making & examination of pt): 35 minutes  06/07/2019, 11:23 AM   Cherene Altes, MD [facility] Office  [phone]

## 2019-06-08 LAB — ABO/RH: ABO/RH(D): A POS

## 2019-06-10 ENCOUNTER — Ambulatory Visit: Payer: BC Managed Care – PPO | Admitting: Cardiovascular Disease

## 2019-06-10 LAB — CULTURE, BLOOD (ROUTINE X 2)
Culture: NO GROWTH
Culture: NO GROWTH
Special Requests: ADEQUATE
Special Requests: ADEQUATE

## 2019-06-17 ENCOUNTER — Other Ambulatory Visit: Payer: Self-pay | Admitting: Internal Medicine

## 2019-06-17 DIAGNOSIS — J1282 Pneumonia due to coronavirus disease 2019: Secondary | ICD-10-CM

## 2019-07-23 ENCOUNTER — Other Ambulatory Visit: Payer: Self-pay

## 2020-02-09 ENCOUNTER — Other Ambulatory Visit: Payer: Self-pay

## 2020-02-09 ENCOUNTER — Encounter: Payer: Self-pay | Admitting: Emergency Medicine

## 2020-02-09 ENCOUNTER — Ambulatory Visit (INDEPENDENT_AMBULATORY_CARE_PROVIDER_SITE_OTHER)
Admit: 2020-02-09 | Discharge: 2020-02-09 | Disposition: A | Discharge status: Home / Self Care | Payer: BC Managed Care – PPO | Attending: Internal Medicine | Admitting: Internal Medicine

## 2020-02-09 ENCOUNTER — Ambulatory Visit
Admission: EM | Admit: 2020-02-09 | Discharge: 2020-02-09 | Disposition: A | Discharge status: Home / Self Care | Payer: BC Managed Care – PPO | Attending: Internal Medicine | Admitting: Internal Medicine

## 2020-02-09 ENCOUNTER — Ambulatory Visit (INDEPENDENT_AMBULATORY_CARE_PROVIDER_SITE_OTHER): Payer: BC Managed Care – PPO

## 2020-02-09 DIAGNOSIS — K5792 Diverticulitis of intestine, part unspecified, without perforation or abscess without bleeding: Secondary | ICD-10-CM | POA: Insufficient documentation

## 2020-02-09 LAB — URINALYSIS, COMPLETE (UACMP) WITH MICROSCOPIC
Bacteria, UA: NONE SEEN
Glucose, UA: NEGATIVE mg/dL
Ketones, ur: 80 mg/dL — AB
Leukocytes,Ua: NEGATIVE
Nitrite: NEGATIVE
Protein, ur: 30 mg/dL — AB
Specific Gravity, Urine: 1.025 (ref 1.005–1.030)
pH: 6 (ref 5.0–8.0)

## 2020-02-09 LAB — BASIC METABOLIC PANEL
Anion gap: 14 (ref 5–15)
BUN: 13 mg/dL (ref 8–23)
CO2: 24 mmol/L (ref 22–32)
Calcium: 10.2 mg/dL (ref 8.9–10.3)
Chloride: 101 mmol/L (ref 98–111)
Creatinine, Ser: 0.72 mg/dL (ref 0.44–1.00)
GFR calc Af Amer: >60 mL/min (ref >60)
GFR calc non Af Amer: >60 mL/min (ref >60)
Glucose, Bld: 106 mg/dL — ABNORMAL HIGH (ref 70–99)
Potassium: 4 mmol/L (ref 3.5–5.1)
Sodium: 139 mmol/L (ref 135–145)

## 2020-02-09 LAB — CBC WITH DIFFERENTIAL/PLATELET
Abs Immature Granulocytes: 0.03 10*3/uL (ref 0.00–0.07)
Basophils Absolute: 0.1 10*3/uL (ref 0.0–0.1)
Basophils Relative: 1 %
Eosinophils Absolute: 0.1 10*3/uL (ref 0.0–0.5)
Eosinophils Relative: 1 %
HCT: 42.4 % (ref 36.0–46.0)
Hemoglobin: 13.9 g/dL (ref 12.0–15.0)
Immature Granulocytes: 0 %
Lymphocytes Relative: 22 %
Lymphs Abs: 2.2 10*3/uL (ref 0.7–4.0)
MCH: 28 pg (ref 26.0–34.0)
MCHC: 32.8 g/dL (ref 30.0–36.0)
MCV: 85.5 fL (ref 80.0–100.0)
Monocytes Absolute: 0.8 10*3/uL (ref 0.1–1.0)
Monocytes Relative: 9 %
Neutro Abs: 6.7 10*3/uL (ref 1.7–7.7)
Neutrophils Relative %: 67 %
Platelets: 289 10*3/uL (ref 150–400)
RBC: 4.96 MIL/uL (ref 3.87–5.11)
RDW: 13.2 % (ref 11.5–15.5)
WBC: 9.9 10*3/uL (ref 4.0–10.5)
nRBC: 0 % (ref 0.0–0.2)

## 2020-02-09 MED ORDER — METRONIDAZOLE 500 MG PO TABS: 500.0000 mg | ORAL_TABLET | Freq: Three times a day (TID) | ORAL | 0 refills | Status: AC

## 2020-02-09 MED ORDER — CIPROFLOXACIN HCL 500 MG PO TABS: 500.0000 mg | ORAL_TABLET | Freq: Two times a day (BID) | ORAL | 0 refills | Status: AC

## 2020-02-09 MED ORDER — IBUPROFEN 600 MG PO TABS: 600.0000 mg | ORAL_TABLET | Freq: Four times a day (QID) | ORAL | 0 refills | Status: AC | PRN

## 2020-02-09 MED ORDER — ONDANSETRON 4 MG PO TBDP: 4.0000 mg | ORAL_TABLET | Freq: Three times a day (TID) | ORAL | 0 refills | Status: AC | PRN

## 2020-02-09 NOTE — ED Triage Notes (Signed)
Patient in today c/o lower abdominal pain off & on x 1 week. Patient states she has some urinary frequency, but no pain. Patient states she feels like she is not emptying her bowels completely and has pain when she tries to have a bowel movement.

## 2020-02-09 NOTE — ED Triage Notes (Signed)
Called [employer] to get pre-cert for CT Abd/Pelvis without contrast. Spoke to Bunk Foss. Approved. Auth# [id number] valid 02/09/20 to 03/09/20.

## 2020-02-09 NOTE — ED Provider Notes (Signed)
[facility]    CSN: [id number] Arrival date & time: 02/09/20  0957      History   Chief Complaint Chief Complaint  Patient presents with  . Abdominal Pain    HPI Shelby Craig is a 69 y.o. female comes to the urgent care with complaint of lower abdominal pain, feeling of incomplete emptying of her bowel of 1 week duration.  Patient symptoms started insidiously and has been more frequent over the past few days.  She denies any subjective fever.  No nausea or vomiting.  Patient has loose bowel movements but that started after she take some laxatives.  No trauma to the abdomen.  Patient has a history of diverticulosis without diverticulitis.  Last colonoscopy is over 10 years ago.  Patient denies any weight loss.  No blood in stool.  No mucus in stool.   HPI  Past Medical History:  Diagnosis Date  . Anemia   . Chronic UTI   . Depression   . Esophageal stricture   . H/O gastroesophageal reflux (GERD)   . History of esophageal stricture   . Vitamin D deficiency     Patient Active Problem List   Diagnosis Date Noted  . PAF (paroxysmal atrial fibrillation) ([place]) 06/07/2019  . COVID-19 06/05/2019  . Pneumonia due to COVID-19 virus 06/05/2019  . Other neutropenia ([place]) 03/18/2018    Past Surgical History:  Procedure Laterality Date  . COLONOSCOPY  2006  . ENDOMETRIAL ABLATION    . TUBAL LIGATION    . UPPER GASTROINTESTINAL ENDOSCOPY  01/01/2013, 11/24/2004    OB History   No obstetric history on file.      Home Medications    Prior to Admission medications   Medication Sig Start Date End Date Taking? Authorizing Provider  acetaminophen (TYLENOL) 325 MG tablet Take 2 tablets (650 mg total) by mouth every 6 (six) hours as needed for mild pain or headache (fever >/= 101). 06/07/19  Yes Cherene Altes, MD  ELDERBERRY PO Take 1 capsule by mouth daily.   Yes [provider]  metoprolol succinate (TOPROL XL) 25 MG 24 hr tablet Take 1 tablet  (25 mg total) by mouth daily. 03/19/19 03/18/20 Yes Nena Polio, MD  omeprazole (PRILOSEC) 20 MG capsule Take 20 mg by mouth daily.   Yes [provider]  zinc gluconate 50 MG tablet Take 50 mg by mouth daily.   Yes [provider]  ciprofloxacin (CIPRO) 500 MG tablet Take 1 tablet (500 mg total) by mouth every 12 (twelve) hours for 7 days. 02/09/20 02/16/20  Chase Picket, MD  ibuprofen (ADVIL) 600 MG tablet Take 1 tablet (600 mg total) by mouth every 6 (six) hours as needed. 02/09/20   Lynda Wanninger, Myrene Galas, MD  metroNIDAZOLE (FLAGYL) 500 MG tablet Take 1 tablet (500 mg total) by mouth 3 (three) times daily for 7 days. 02/09/20 02/16/20  Chase Picket, MD  ondansetron (ZOFRAN ODT) 4 MG disintegrating tablet Take 1 tablet (4 mg total) by mouth every 8 (eight) hours as needed for nausea or vomiting. 02/09/20   Dametri Ozburn, Myrene Galas, MD  apixaban (ELIQUIS) 5 MG TABS tablet Take 1 tablet (5 mg total) by mouth 2 (two) times daily. 06/07/19 02/09/20  Cherene Altes, MD    Family History Family History  Problem Relation Age of Onset  . Cancer Mother   . Cancer Father   . Multiple myeloma Father        multiple myeloma, agent orange  exposure  . Breast cancer Maternal Aunt   . HIV Brother   . Brain cancer Paternal Grandmother     Social History Social History   Tobacco Use  . Smoking status: Former Smoker    Packs/day: 1.00    Years: 10.00    Pack years: 10.00    Types: Cigarettes    Quit date: 02/08/1990    Years since quitting: 30.0  . Smokeless tobacco: Never Used  Vaping Use  . Vaping Use: Never used  Substance Use Topics  . Alcohol use: No  . Drug use: Never     Allergies   Codeine and Wasp venom protein   Review of Systems Review of Systems  Constitutional: Negative for activity change, chills, fatigue and fever.  Gastrointestinal: Positive for abdominal pain and diarrhea. Negative for abdominal distention, anal bleeding, blood in stool, constipation,  nausea, rectal pain and vomiting.  Genitourinary: Positive for dysuria and urgency. Negative for frequency.  Musculoskeletal: Negative.   Neurological: Negative for dizziness, light-headedness and headaches.     Physical Exam Triage Vital Signs ED Triage Vitals  Enc Vitals Group     BP 02/09/20 1103 102/79     Pulse Rate 02/09/20 1103 71     Resp 02/09/20 1103 18     Temp 02/09/20 1103 98.1 F (36.7 C)     Temp Source 02/09/20 1103 Oral     SpO2 02/09/20 1103 99 %     Weight 02/09/20 1104 175 lb (79.4 kg)     Height 02/09/20 1104 _0  (1.626 m)     Head Circumference --      Peak Flow --      Pain Score 02/09/20 1103 6     Pain Loc --      Pain Edu? --      Excl. in [place]? --    No data found.  Updated Vital Signs BP 102/79 (BP Location: Left Arm)   Pulse 71   Temp 98.1 F (36.7 C) (Oral)   Resp 18   Ht _1  (1.626 m)   Wt 79.4 kg   LMP  (LMP Unknown)   SpO2 99%   BMI 30.04 kg/m   Visual Acuity Right Eye Distance:   Left Eye Distance:   Bilateral Distance:    Right Eye Near:   Left Eye Near:    Bilateral Near:     Physical Exam Vitals and nursing note reviewed.  Constitutional:      General: She is not in acute distress.    Appearance: She is not ill-appearing.  Cardiovascular:     Rate and Rhythm: Normal rate and regular rhythm.  Pulmonary:     Effort: Pulmonary effort is normal. No respiratory distress.     Breath sounds: Normal breath sounds. No wheezing, rhonchi or rales.  Abdominal:     General: Abdomen is flat. There is no distension or abdominal bruit.     Palpations: Abdomen is soft. There is no shifting dullness, hepatomegaly or splenomegaly.     Tenderness: There is abdominal tenderness in the left lower quadrant. There is no right CVA tenderness, left CVA tenderness, guarding or rebound.     Hernia: No hernia is present.  Skin:    Capillary Refill: Capillary refill takes less than 2 seconds.  Neurological:     Mental Status: She is alert.       UC Treatments / Results  Labs (all labs ordered are listed, but only abnormal results are displayed) Labs  Reviewed  URINALYSIS, COMPLETE (UACMP) WITH MICROSCOPIC - Abnormal; Notable for the following components:      Result Value   Color, Urine AMBER (*)    APPearance CLOUDY (*)    Hgb urine dipstick SMALL (*)    Bilirubin Urine MODERATE (*)    Ketones, ur 80 (*)    Protein, ur 30 (*)    All other components within normal limits  BASIC METABOLIC PANEL - Abnormal; Notable for the following components:   Glucose, Bld 106 (*)    All other components within normal limits  CBC WITH DIFFERENTIAL/PLATELET    EKG   Radiology CT ABDOMEN PELVIS WO CONTRAST  Result Date: 02/09/2020 CLINICAL DATA:  Patient in today c/o lower abdominal pain off AND on x 1 week. Patient states she has some urinary frequency, but no pain. Patient states she feels like she is not emptying her bowels completely and has pain when she tries to have a bowel movement. EXAM: CT ABDOMEN AND PELVIS WITHOUT CONTRAST TECHNIQUE: Multidetector CT imaging of the abdomen and pelvis was performed following the standard protocol without IV contrast. COMPARISON:  06/11/2013 FINDINGS: Lower chest: Calcification with an associated small focus of opacity in the posterior right lower lobe similar to prior studies. Lung bases otherwise clear. Hepatobiliary: Liver normal in size. Decreased liver attenuation consistent with fatty infiltration. No masses. Dependent 1.9 cm gallstone no evidence of acute cholecystitis. No bile duct dilation. Pancreas: Unremarkable. No pancreatic ductal dilatation or surrounding inflammatory changes. Spleen: Normal in size. No masses. Scattered calcifications consistent with healed granuloma. Adrenals/Urinary Tract: Adrenal glands are unremarkable. Kidneys are normal, without renal calculi, focal lesion, or hydronephrosis. Bladder is unremarkable. Stomach/Bowel: Wall thickening and adjacent inflammation of  the proximal sigmoid colon centered on an ill-defined diverticulum. No extraluminal air or free air. No fluid collection to suggest an abscess. There are multiple additional colon diverticula without additional evidence of inflammation. Small hiatal hernia. Stomach otherwise unremarkable. Normal small bowel. Normal appendix. Vascular/Lymphatic: No significant vascular findings are present. No enlarged abdominal or pelvic lymph nodes. Reproductive: Uterus and bilateral adnexa are unremarkable. Other: No abdominal wall hernia or abnormality. No abdominopelvic ascites. Musculoskeletal: No fracture or acute finding. No osteoblastic or osteolytic lesions. IMPRESSION: 1. Acute uncomplicated sigmoid colon diverticulitis. No extraluminal or free air. No abscess. 2. No other acute abnormality within the abdomen or pelvis. 3. Gallstone, small hiatal hernia and hepatic steatosis. Electronically Signed   By: Lajean Manes M.D.   On: 02/09/2020 12:56   DG Abdomen 1 View  Result Date: 02/09/2020 CLINICAL DATA:  Lower abdominal pain. EXAM: ABDOMEN - 1 VIEW COMPARISON:  None. FINDINGS: The bowel gas pattern is normal. No radio-opaque calculi or other significant radiographic abnormality are seen. Bilateral facet arthropathy at L5-S1. IMPRESSION: Negative. Electronically Signed   By: Kathreen Devoid   On: 02/09/2020 11:38    Procedures Procedures (including critical care time)  Medications Ordered in UC Medications - No data to display  Initial Impression / Assessment and Plan / UC Course  I have reviewed the triage vital signs and the nursing notes.  Pertinent labs & imaging results that were available during my care of the patient were reviewed by me and considered in my medical decision making (see chart for details).     1.  Acute sigmoid diverticulitis: Ciprofloxacin 500 mg twice daily for 7 days Flagyl 500 mg 3 times daily for 7 days Zofran ODT as needed for nausea/vomiting The CT scan of the abdomen is  remarkable for acute uncomplicated sigmoid diverticulitis CBC showed normal WBC and BMP was unremarkable as well. Return precautions given. Final Clinical Impressions(s) / UC Diagnoses   Final diagnoses:  Acute diverticulitis of intestine   Discharge Instructions   None    ED Prescriptions    Medication Sig Dispense Auth. Provider   ciprofloxacin (CIPRO) 500 MG tablet Take 1 tablet (500 mg total) by mouth every 12 (twelve) hours for 7 days. 14 tablet Abbegail Matuska, Myrene Galas, MD   metroNIDAZOLE (FLAGYL) 500 MG tablet Take 1 tablet (500 mg total) by mouth 3 (three) times daily for 7 days. 21 tablet Anhthu Perdew, Myrene Galas, MD   ondansetron (ZOFRAN ODT) 4 MG disintegrating tablet Take 1 tablet (4 mg total) by mouth every 8 (eight) hours as needed for nausea or vomiting. 20 tablet Aileena Iglesia, Myrene Galas, MD   ibuprofen (ADVIL) 600 MG tablet Take 1 tablet (600 mg total) by mouth every 6 (six) hours as needed. 30 tablet Tierra Divelbiss, Myrene Galas, MD     PDMP not reviewed this encounter.   Chase Picket, MD 02/09/20 1340

## 2020-04-26 ENCOUNTER — Ambulatory Visit: Payer: Self-pay

## 2021-01-18 IMAGING — CR DG ABDOMEN 1V
2 series · 2 of 2 positions shown · non-contrast
Comparison: None.

CLINICAL DATA: Lower abdominal pain.

EXAM:
ABDOMEN - 1 VIEW

[abdomen kub (1 of 2)]
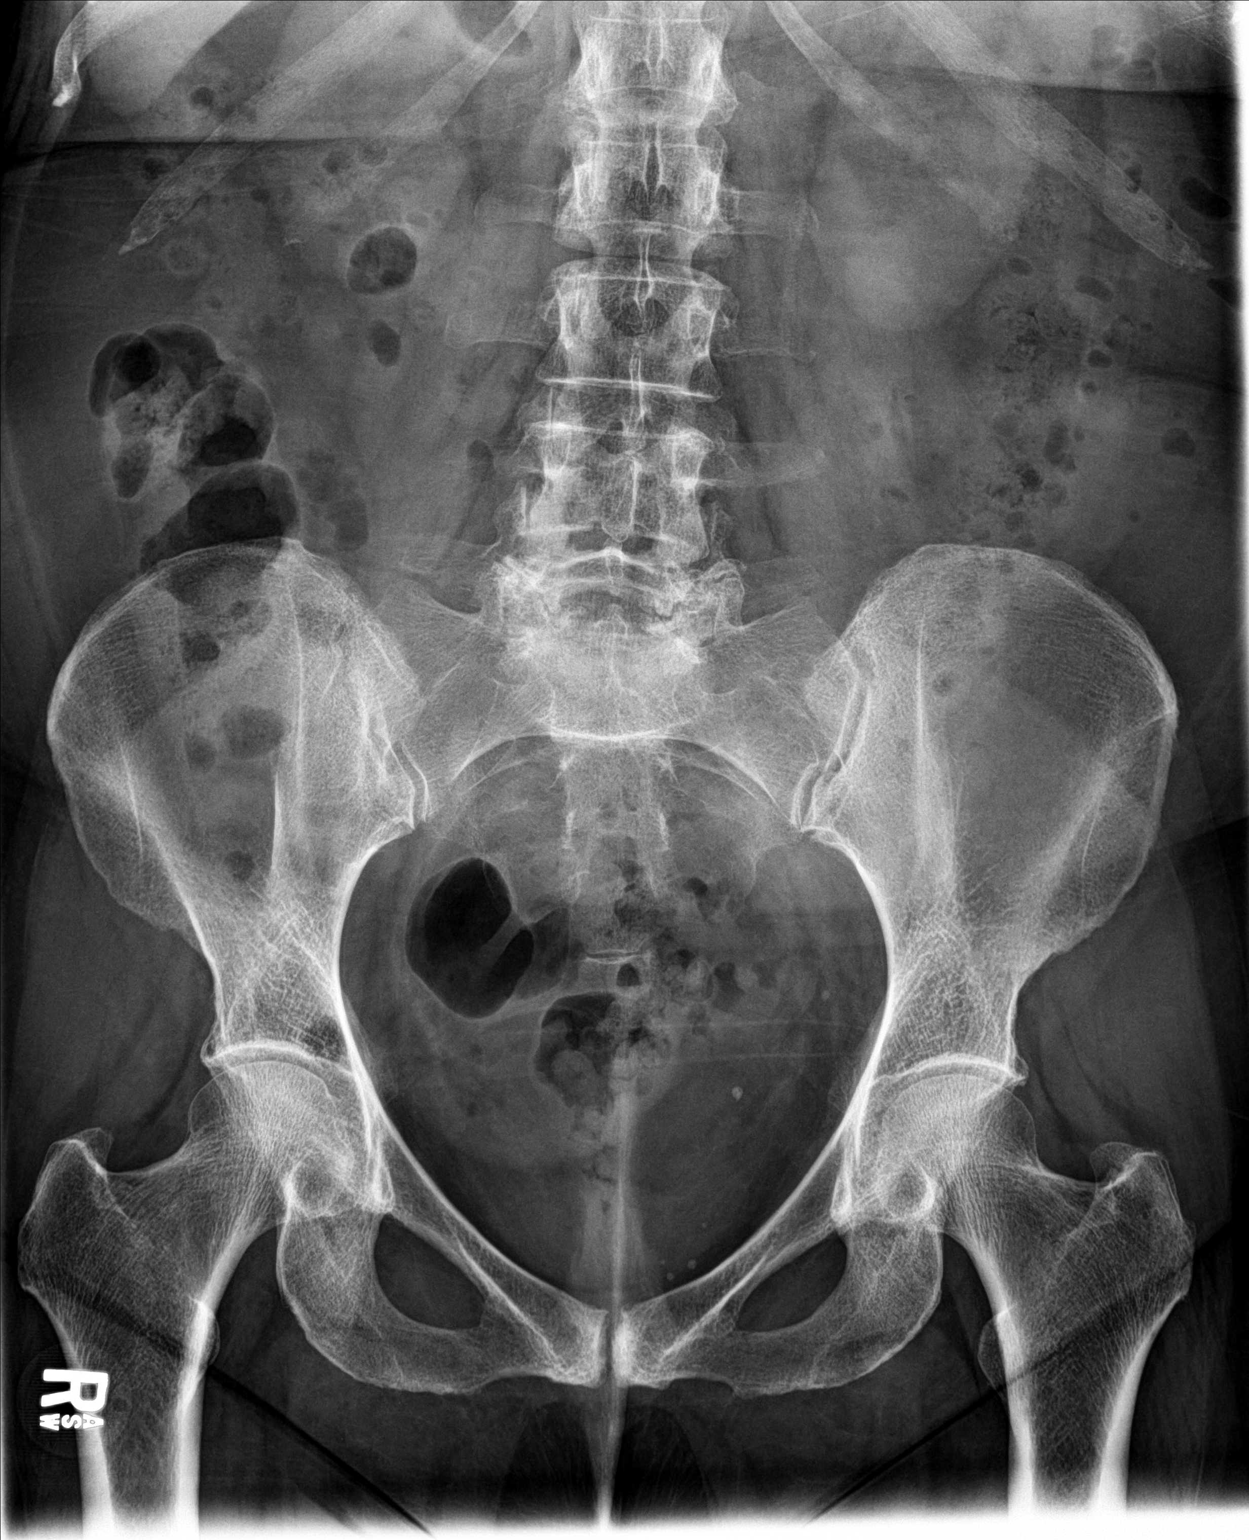

[abdomen kub (2 of 2)]
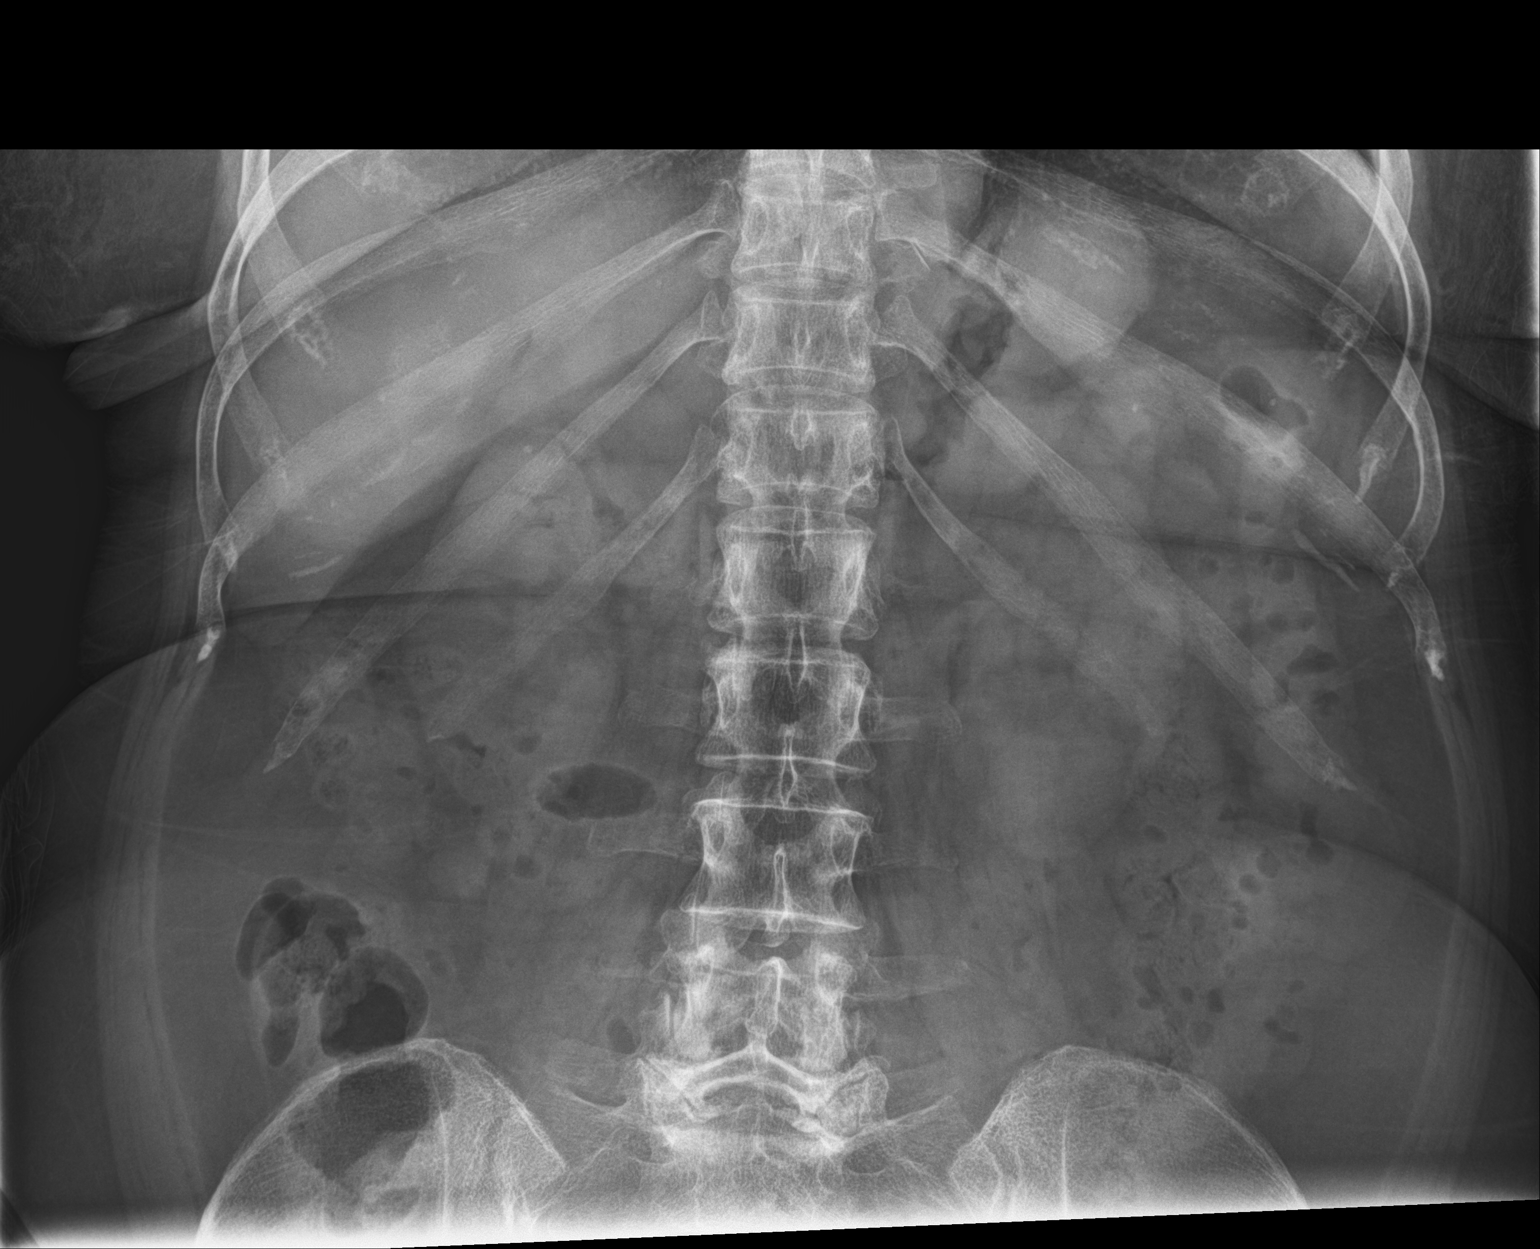

[2 of 2 positions shown; findings below may reference images not displayed]

FINDINGS: The bowel gas pattern is normal. No radio-opaque calculi or other
significant radiographic abnormality are seen.

Bilateral facet arthropathy at L5-S1.
IMPRESSION: Negative.

## 2021-01-18 IMAGING — CT CT ABD-PELV W/O CM
1 of 2 series · 15 of 32 positions shown, 19 images · non-contrast
Comparison: 06/11/2013

CLINICAL DATA: Patient in today c/o lower abdominal pain off AND on
x 1 week. Patient states she has some urinary frequency, but no
completely and has pain when she tries to have a bowel movement.

EXAM:
CT ABDOMEN AND PELVIS WITHOUT CONTRAST
TECHNIQUE: Multidetector CT imaging of the abdomen and pelvis was performed
following the standard protocol without IV contrast.

[Series 2: axial st · axial · 0.73mm/px · z∈[-1006,-580]mm · 15 of 93 slices shown, 19 images]
[im 4/93  soft-tissue]
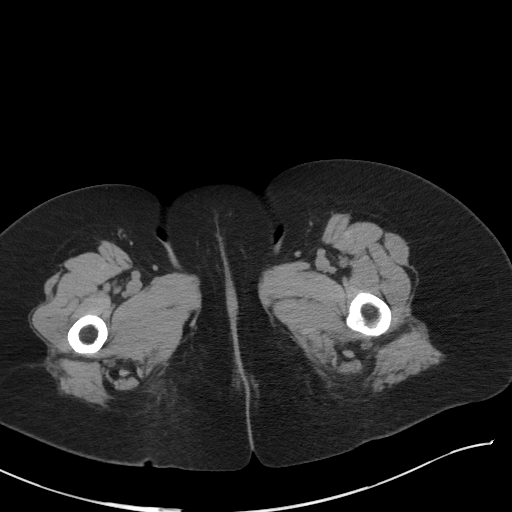
[im 4/93  bone]
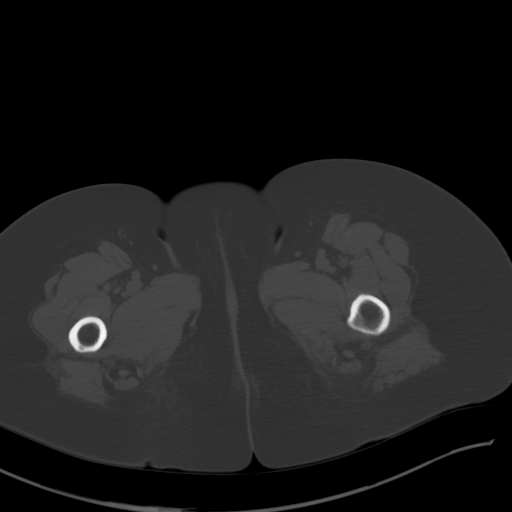
[im 12/93  soft-tissue]
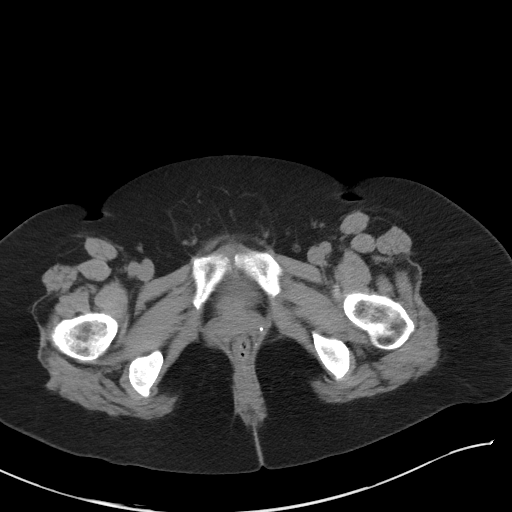
[im 19/93  soft-tissue]
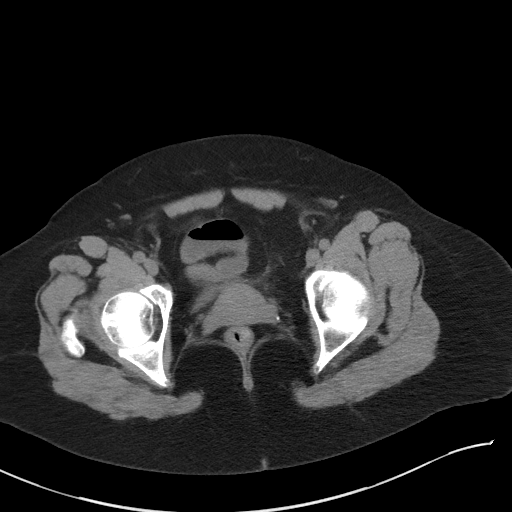
[im 26/93  soft-tissue]
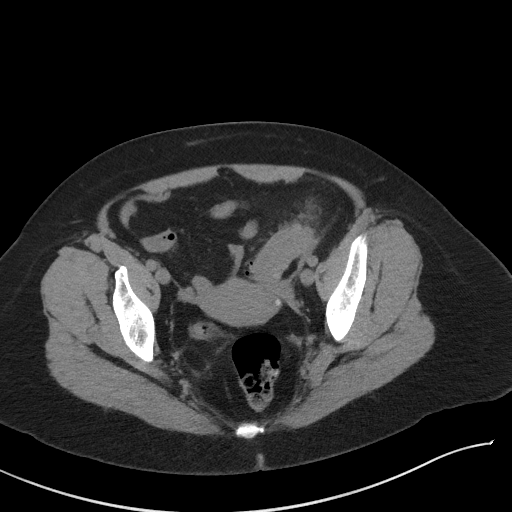
[im 34/93  soft-tissue]
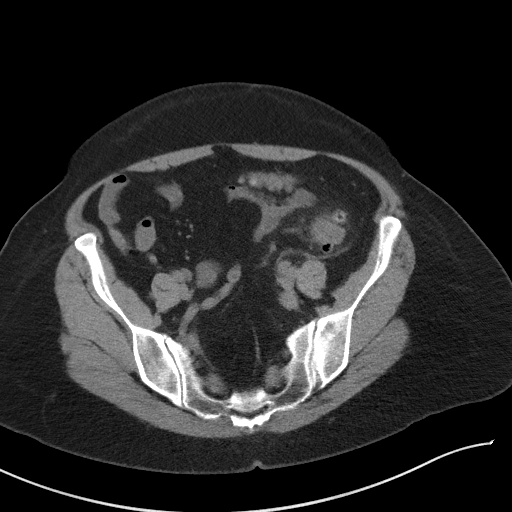
[im 41/93  soft-tissue]
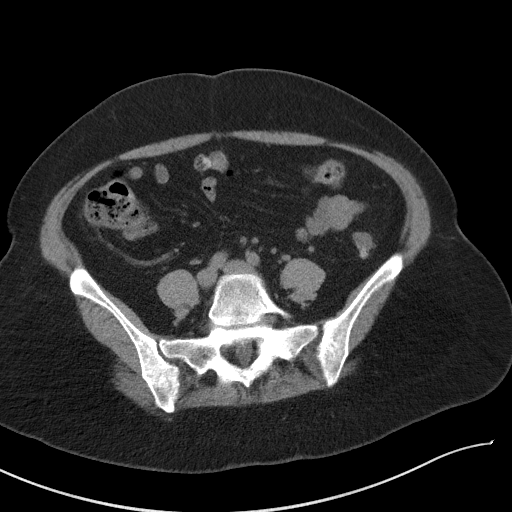
[im 48/93  soft-tissue]
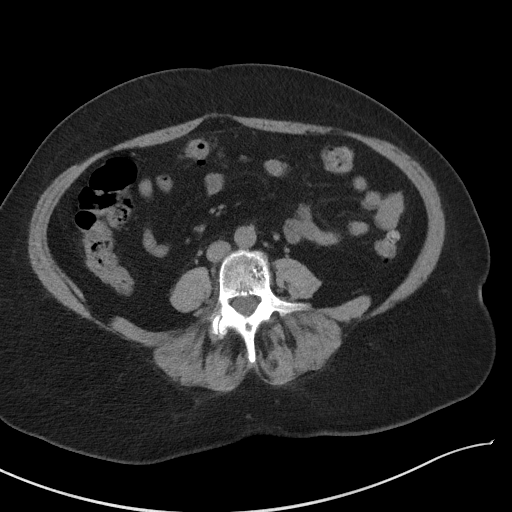
[im 52/93  soft-tissue]
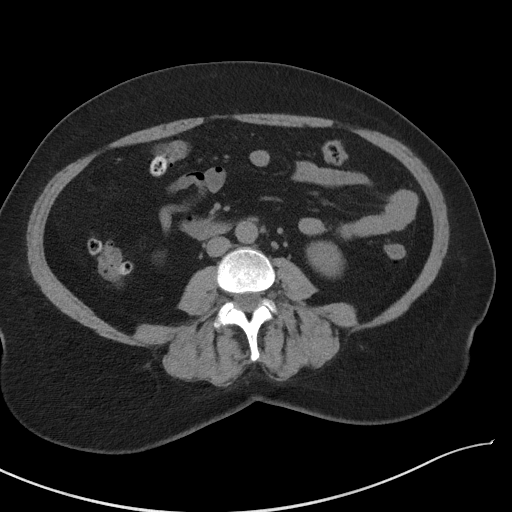
[im 59/93  soft-tissue]
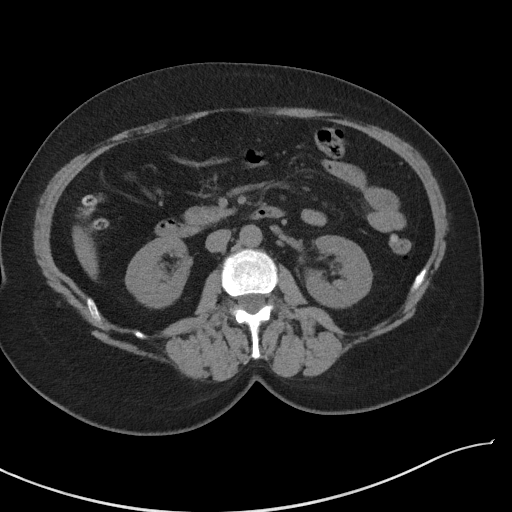
[im 59/93  bone]
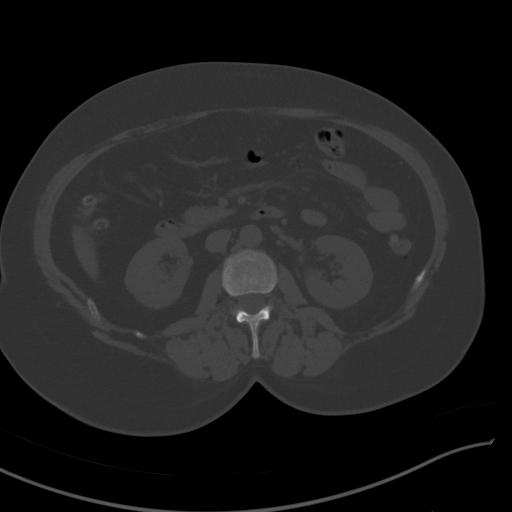
[im 67/93  soft-tissue]
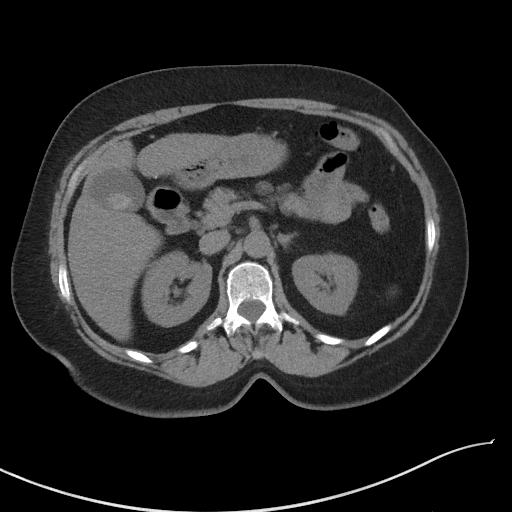
[im 74/93  soft-tissue]
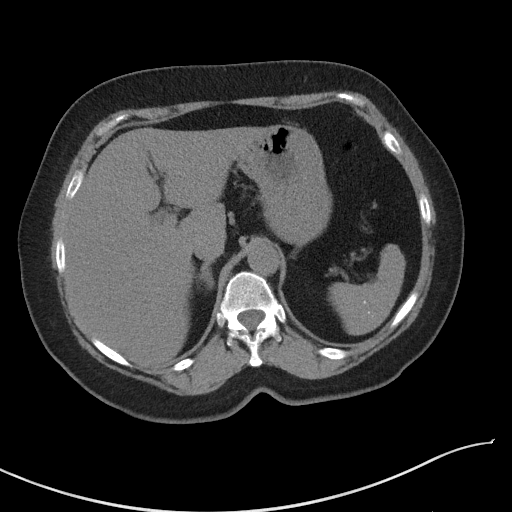
[im 78/93  lung]
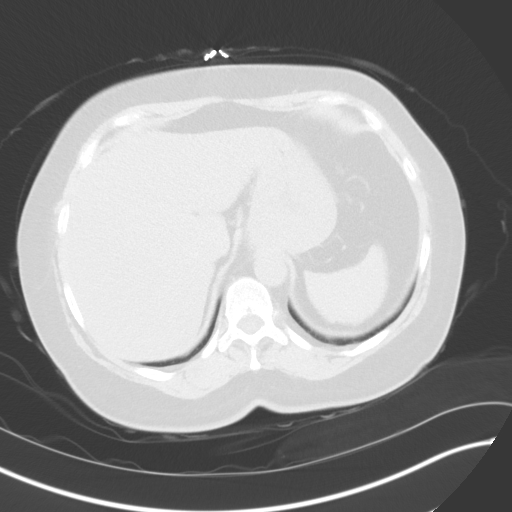
[im 81/93  soft-tissue]
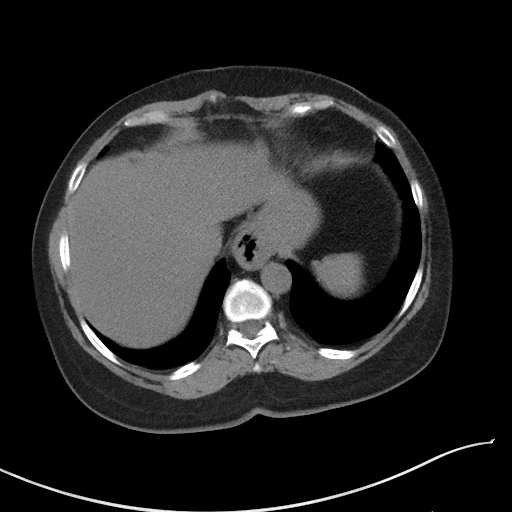
[im 81/93  lung]
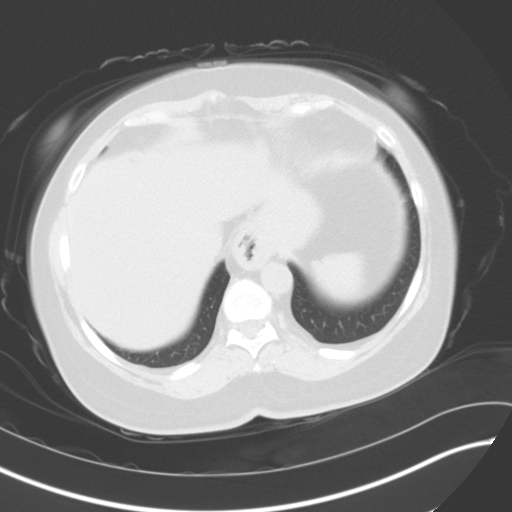
[im 85/93  lung]
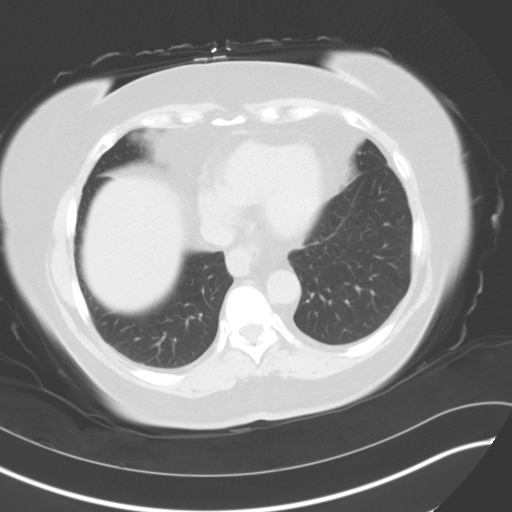
[im 89/93  soft-tissue]
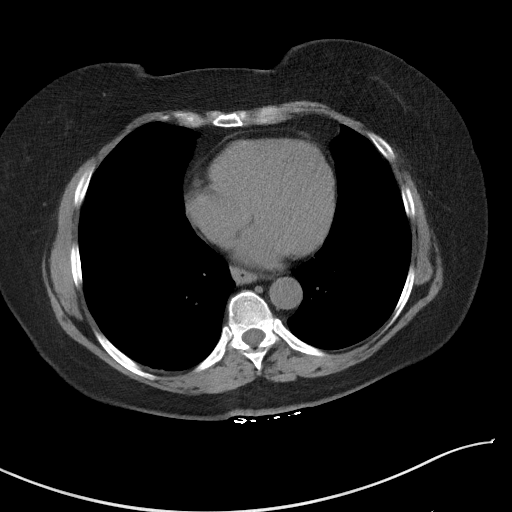
[im 89/93  lung]
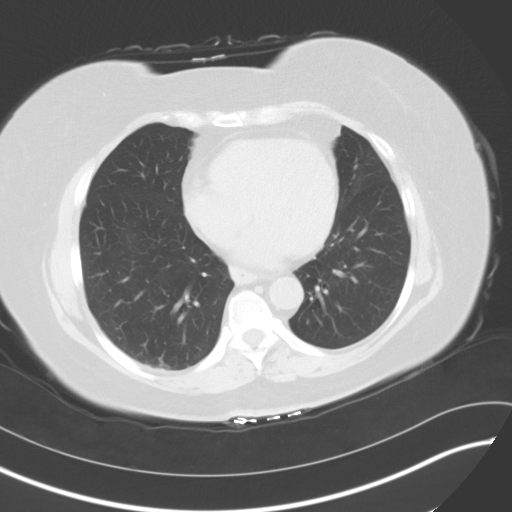

[15 of 32 positions shown; findings below may reference images not displayed]

FINDINGS: Lower chest: Calcification with an associated small focus of opacity
in the posterior right lower lobe similar to prior studies. Lung
bases otherwise clear.

Hepatobiliary: Liver normal in size. Decreased liver attenuation
consistent with fatty infiltration. No masses. Dependent 1.9 cm
gallstone no evidence of acute cholecystitis. No bile duct dilation.

Pancreas: Unremarkable. No pancreatic ductal dilatation or
surrounding inflammatory changes.

Spleen: Normal in size. No masses. Scattered calcifications
consistent with healed granuloma.

Adrenals/Urinary Tract: Adrenal glands are unremarkable. Kidneys are
normal, without renal calculi, focal lesion, or hydronephrosis.
Bladder is unremarkable.

Stomach/Bowel: Wall thickening and adjacent inflammation of the
proximal sigmoid colon centered on an ill-defined diverticulum. No
extraluminal air or free air. No fluid collection to suggest an
abscess. There are multiple additional colon diverticula without
additional evidence of inflammation.

Small hiatal hernia. Stomach otherwise unremarkable. Normal small
bowel. Normal appendix.

Vascular/Lymphatic: No significant vascular findings are present. No
enlarged abdominal or pelvic lymph nodes.

Reproductive: Uterus and bilateral adnexa are unremarkable.

Other: No abdominal wall hernia or abnormality. No abdominopelvic
ascites.

Musculoskeletal: No fracture or acute finding. No osteoblastic or
osteolytic lesions.
IMPRESSION: 1. Acute uncomplicated sigmoid colon diverticulitis. No extraluminal
or free air. No abscess.
2. No other acute abnormality within the abdomen or pelvis.
3. Gallstone, small hiatal hernia and hepatic steatosis.
